# Patient Record
Sex: Male | Born: 1950 | Race: White | Hispanic: No | Marital: Married | Smoking: Current every day smoker
Health system: Southern US, Community
[De-identification: ages and names within clinical notes are randomized; demographics above are authoritative.]

## PROBLEM LIST (undated history)

## (undated) DIAGNOSIS — Z974 Presence of external hearing-aid: Secondary | ICD-10-CM

## (undated) DIAGNOSIS — C801 Malignant (primary) neoplasm, unspecified: Secondary | ICD-10-CM

## (undated) DIAGNOSIS — R569 Unspecified convulsions: Secondary | ICD-10-CM

## (undated) DIAGNOSIS — E7800 Pure hypercholesterolemia, unspecified: Secondary | ICD-10-CM

## (undated) DIAGNOSIS — M1990 Unspecified osteoarthritis, unspecified site: Secondary | ICD-10-CM

## (undated) DIAGNOSIS — K219 Gastro-esophageal reflux disease without esophagitis: Secondary | ICD-10-CM

## (undated) DIAGNOSIS — Z8679 Personal history of other diseases of the circulatory system: Secondary | ICD-10-CM

## (undated) DIAGNOSIS — I639 Cerebral infarction, unspecified: Secondary | ICD-10-CM

## (undated) HISTORY — PX: HERNIA REPAIR: SHX51

## (undated) HISTORY — PX: KNEE ARTHROSCOPY: SHX127

## (undated) HISTORY — PX: MOUTH SURGERY: SHX715

---

## 2004-08-08 ENCOUNTER — Ambulatory Visit: Payer: Self-pay | Admitting: Unknown Physician Specialty

## 2004-10-04 ENCOUNTER — Ambulatory Visit: Payer: Self-pay | Admitting: Unknown Physician Specialty

## 2007-03-03 ENCOUNTER — Ambulatory Visit: Payer: Self-pay | Admitting: Internal Medicine

## 2007-03-04 ENCOUNTER — Ambulatory Visit: Payer: Self-pay | Admitting: Internal Medicine

## 2007-07-10 ENCOUNTER — Ambulatory Visit: Payer: Self-pay | Admitting: Emergency Medicine

## 2007-09-01 ENCOUNTER — Ambulatory Visit: Payer: Self-pay | Admitting: Emergency Medicine

## 2007-11-25 ENCOUNTER — Ambulatory Visit: Payer: Self-pay | Admitting: Internal Medicine

## 2008-05-24 ENCOUNTER — Other Ambulatory Visit: Payer: Self-pay

## 2008-05-24 ENCOUNTER — Emergency Department: Payer: Self-pay | Admitting: Emergency Medicine

## 2009-11-29 ENCOUNTER — Ambulatory Visit: Payer: Self-pay | Admitting: Cardiovascular Disease

## 2009-11-30 ENCOUNTER — Ambulatory Visit: Payer: Self-pay | Admitting: Cardiovascular Disease

## 2010-04-12 ENCOUNTER — Ambulatory Visit: Payer: Self-pay | Admitting: Internal Medicine

## 2010-07-17 ENCOUNTER — Ambulatory Visit: Payer: Self-pay | Admitting: Internal Medicine

## 2011-02-21 ENCOUNTER — Ambulatory Visit: Payer: Self-pay | Admitting: Family Medicine

## 2011-07-23 ENCOUNTER — Ambulatory Visit: Payer: Self-pay

## 2011-08-29 DIAGNOSIS — C069 Malignant neoplasm of mouth, unspecified: Secondary | ICD-10-CM | POA: Insufficient documentation

## 2011-09-27 ENCOUNTER — Ambulatory Visit: Payer: Self-pay | Admitting: Emergency Medicine

## 2011-09-27 DIAGNOSIS — I10 Essential (primary) hypertension: Secondary | ICD-10-CM

## 2011-10-02 ENCOUNTER — Ambulatory Visit: Payer: Self-pay | Admitting: Emergency Medicine

## 2013-04-15 ENCOUNTER — Ambulatory Visit: Payer: Self-pay | Admitting: Physician Assistant

## 2013-04-29 ENCOUNTER — Ambulatory Visit: Payer: Self-pay | Admitting: Physician Assistant

## 2013-06-08 ENCOUNTER — Ambulatory Visit: Payer: Self-pay | Admitting: Unknown Physician Specialty

## 2013-06-09 LAB — PATHOLOGY REPORT

## 2013-10-29 ENCOUNTER — Ambulatory Visit: Payer: Self-pay | Admitting: Physician Assistant

## 2013-10-29 LAB — RAPID INFLUENZA A&B ANTIGENS

## 2013-10-29 LAB — RAPID STREP-A WITH REFLX: Micro Text Report: NEGATIVE

## 2013-11-01 LAB — BETA STREP CULTURE(ARMC)

## 2014-09-02 ENCOUNTER — Ambulatory Visit: Payer: Self-pay | Admitting: Internal Medicine

## 2014-10-13 ENCOUNTER — Ambulatory Visit: Payer: Self-pay | Admitting: Emergency Medicine

## 2014-10-13 LAB — RAPID STREP-A WITH REFLX: Micro Text Report: NEGATIVE

## 2014-10-16 LAB — BETA STREP CULTURE(ARMC)

## 2014-11-25 ENCOUNTER — Ambulatory Visit: Payer: Self-pay | Admitting: Physician Assistant

## 2015-01-16 NOTE — Op Note (Signed)
PATIENT NAME:  Erik Thompson, Erik Thompson MR#:  [id number] DATE OF BIRTH:  [birth year]  DATE OF PROCEDURE:  10/02/2011  PREOPERATIVE DIAGNOSIS: Left inguinal hernia.   POSTOPERATIVE DIAGNOSIS: Left inguinal hernia.  OPERATION: Repair of left inguinal hernia with mesh.   SURGEON: Vella Kohler, MD    OPERATIVE FINDING:  1. A direct hernia.  2. No evidence of any indirect hernia seen.   DESCRIPTION OF PROCEDURE: This patient was having pain in the left groin and feels a lump in the left groin. Here surgery was performed on the right side when he was young. The patient was then brought to surgery under general anesthesia. The left groin was then prepped and draped. A small incision was made. After cutting skin and subcutaneous tissue, the fascia was cut. The external oblique was quite effaced with the cord. The cord structures were very thin. The external oblique was opened. The ilioinguinal nerve was saved, and the cord was then lifted up.  I checked thoroughly the cord and did not see any indirect hernia. The patient does have a direct hernia. After that, a piece of mesh was then put in the floor of the inguinal canal and then brought around the cord and sutured with 0 Surgilon sutures to the shelving edge of inguinal ligament very clearly as to the conjoined tendon area and around the cord. After the external oblique was closed partially to form the new ring, the external oblique was then closed with 0 Vicryl sutures. Skin was closed with the staples. Marcaine was injected. The patient tolerated the procedure well and was sent to the recovery room in satisfactory condition.  ____________________________ Welford Roche Phylis Bougie, MD [id number] D: 10/02/2011 13:44:36 ET T: 10/02/2011 15:29:47 ET JOB#: [id number]  cc: Urban Naval S. Phylis Bougie, MD, <Dictator> Venetia Maxon. Elijio Miles, MD Sharene Butters MD ELECTRONICALLY SIGNED 10/04/2011 10:18

## 2015-06-11 ENCOUNTER — Encounter: Payer: Self-pay | Admitting: *Deleted

## 2015-06-11 ENCOUNTER — Ambulatory Visit
Admission: EM | Admit: 2015-06-11 | Discharge: 2015-06-11 | Disposition: A | Discharge status: Home / Self Care | Payer: BLUE CROSS/BLUE SHIELD | Attending: Family Medicine | Admitting: Family Medicine

## 2015-06-11 DIAGNOSIS — J0100 Acute maxillary sinusitis, unspecified: Secondary | ICD-10-CM | POA: Diagnosis not present

## 2015-06-11 LAB — RAPID STREP SCREEN (MED CTR MEBANE ONLY): Streptococcus, Group A Screen (Direct): NEGATIVE

## 2015-06-11 MED ORDER — AMOXICILLIN 875 MG PO TABS: 875.0000 mg | ORAL_TABLET | Freq: Two times a day (BID) | ORAL | Status: DC

## 2015-06-11 NOTE — ED Notes (Signed)
Pt states that he started having sore throat, body aches, fever, congestion about 3 days ago.

## 2015-06-11 NOTE — ED Provider Notes (Signed)
CSN: [id number]     Arrival date & time 06/11/15  1000 History   First MD Initiated Contact with Patient 06/11/15 1123     Chief Complaint  Patient presents with  . Sore Throat  . Cough  . Generalized Body Aches   (Consider location/radiation/quality/duration/timing/severity/associated sxs/prior Treatment) Patient is a 64 y.o. male presenting with pharyngitis and cough. The history is provided by the patient.  Sore Throat This is a new problem. The current episode started more than 2 days ago. The problem occurs constantly. Associated symptoms include headaches. Pertinent negatives include no chest pain, no abdominal pain and no shortness of breath. Associated symptoms comments: Sinus pressure, sinus headache, nasal congestion. Nothing relieves the symptoms.  Cough Associated symptoms: headaches   Associated symptoms: no chest pain and no shortness of breath     History reviewed. No pertinent past medical history. History reviewed. No pertinent past surgical history. No family history on file. Social History  Substance Use Topics  . Smoking status: Current Every Day Smoker  . Smokeless tobacco: None  . Alcohol Use: No    Review of Systems  Respiratory: Positive for cough. Negative for shortness of breath.   Cardiovascular: Negative for chest pain.  Gastrointestinal: Negative for abdominal pain.  Neurological: Positive for headaches.    Allergies  Sulfa antibiotics  Home Medications   Prior to Admission medications   Medication Sig Start Date End Date Taking? Authorizing Provider  cetirizine (ZYRTEC) 10 MG tablet Take 10 mg by mouth daily.   Yes Historical Provider, MD  Fish Oil-Cholecalciferol (FISH OIL + D3 PO) Take by mouth.   Yes Historical Provider, MD  omeprazole (PRILOSEC OTC) 20 MG tablet Take 20 mg by mouth daily.   Yes Historical Provider, MD  rosuvastatin (CRESTOR) 5 MG tablet Take 5 mg by mouth daily.   Yes Historical Provider, MD  amoxicillin (AMOXIL) 875 MG  tablet Take 1 tablet (875 mg total) by mouth 2 (two) times daily. 06/11/15   Norval Gable, MD   Meds Ordered and Administered this Visit  Medications - No data to display  BP 136/70 mmHg  Pulse 71  Temp(Src) 98.5 F (36.9 C) (Oral)  Ht 5' 10.5" (1.791 m)  Wt 154 lb (69.854 kg)  BMI 21.78 kg/m2  SpO2 100% No data found.   Physical Exam  Constitutional: He appears well-developed and well-nourished. No distress.  HENT:  Head: Normocephalic and atraumatic.  Right Ear: Tympanic membrane, external ear and ear canal normal.  Left Ear: Tympanic membrane, external ear and ear canal normal.  Nose: Mucosal edema and rhinorrhea present. Right sinus exhibits maxillary sinus tenderness and frontal sinus tenderness. Left sinus exhibits maxillary sinus tenderness and frontal sinus tenderness.  Mouth/Throat: Uvula is midline and mucous membranes are normal. Posterior oropharyngeal erythema present. No oropharyngeal exudate, posterior oropharyngeal edema or tonsillar abscesses.  Eyes: Conjunctivae and EOM are normal. Pupils are equal, round, and reactive to light. Right eye exhibits no discharge. Left eye exhibits no discharge. No scleral icterus.  Neck: Normal range of motion. Neck supple. No tracheal deviation present. No thyromegaly present.  Cardiovascular: Normal rate, regular rhythm and normal heart sounds.   Pulmonary/Chest: Effort normal and breath sounds normal. No stridor. No respiratory distress. He has no wheezes. He has no rales. He exhibits no tenderness.  Lymphadenopathy:    He has no cervical adenopathy.  Neurological: He is alert.  Skin: Skin is warm and dry. No rash noted. He is not diaphoretic.  Nursing note and vitals  reviewed.   ED Course  Procedures (including critical care time)  Labs Review Labs Reviewed  RAPID STREP SCREEN (NOT AT [facility])  CULTURE, GROUP A STREP ([facility] ONLY)    Imaging Review No results found.   Visual Acuity Review  Right Eye Distance:   Left  Eye Distance:   Bilateral Distance:    Right Eye Near:   Left Eye Near:    Bilateral Near:         MDM   1. Acute maxillary sinusitis, recurrence not specified    Discharge Medication List as of 06/11/2015 11:40 AM    START taking these medications   Details  amoxicillin (AMOXIL) 875 MG tablet Take 1 tablet (875 mg total) by mouth 2 (two) times daily., Starting 06/11/2015, Until Discontinued, Normal       Plan: 1.  diagnosis reviewed with patient 2. rx as per orders; risks, benefits, potential side effects reviewed with patient 3. Recommend supportive treatment with otc analgesics and flonase nasal spray 4. Recommend smoking cessation 5. F/u prn if symptoms worsen or don't improve   Norval Gable, MD 06/11/15 1141

## 2015-06-14 LAB — CULTURE, GROUP A STREP (THRC)

## 2015-09-08 ENCOUNTER — Ambulatory Visit
Admission: EM | Admit: 2015-09-08 | Discharge: 2015-09-08 | Disposition: A | Discharge status: Home / Self Care | Payer: BLUE CROSS/BLUE SHIELD | Attending: Family Medicine | Admitting: Family Medicine

## 2015-09-08 DIAGNOSIS — J069 Acute upper respiratory infection, unspecified: Secondary | ICD-10-CM | POA: Diagnosis not present

## 2015-09-08 HISTORY — DX: Malignant (primary) neoplasm, unspecified: C80.1

## 2015-09-08 HISTORY — DX: Gastro-esophageal reflux disease without esophagitis: K21.9

## 2015-09-08 HISTORY — DX: Pure hypercholesterolemia, unspecified: E78.00

## 2015-09-08 MED ORDER — HYDROCOD POLST-CPM POLST ER 10-8 MG/5ML PO SUER: 5.0000 mL | Freq: Two times a day (BID) | ORAL | Status: DC

## 2015-09-08 MED ORDER — CETIRIZINE-PSEUDOEPHEDRINE ER 5-120 MG PO TB12: 1.0000 | ORAL_TABLET | Freq: Two times a day (BID) | ORAL | Status: DC

## 2015-09-08 NOTE — ED Notes (Signed)
C/o runny nose and cough x 2-3 days. + nasal congestion

## 2015-09-08 NOTE — Discharge Instructions (Signed)
Cool Mist Vaporizers  Vaporizers may help relieve the symptoms of a cough and cold. They add moisture to the air, which helps mucus to become thinner and less sticky. This makes it easier to breathe and cough up secretions. Cool mist vaporizers do not cause serious burns like hot mist vaporizers, which may also be called steamers or humidifiers. Vaporizers have not been proven to help with colds. You should not use a vaporizer if you are allergic to mold.  HOME CARE INSTRUCTIONS  · Follow the package instructions for the vaporizer.  · Do not use anything other than distilled water in the vaporizer.  · Do not run the vaporizer all of the time. This can cause mold or bacteria to grow in the vaporizer.  · Clean the vaporizer after each time it is used.  · Clean and dry the vaporizer well before storing it.  · Stop using the vaporizer if worsening respiratory symptoms develop.     This information is not intended to replace advice given to you by your health care provider. Make sure you discuss any questions you have with your health care provider.     Document Released: 06/07/2004 Document Revised: 09/15/2013 Document Reviewed: 01/28/2013  Elsevier Interactive Patient Education ©2016 Elsevier Inc.

## 2015-09-08 NOTE — ED Provider Notes (Signed)
CSN: [id number]     Arrival date & time 09/08/15  [id number] History   First MD Initiated Contact with Patient 09/08/15 1130     Chief Complaint  Patient presents with  . URI   (Consider location/radiation/quality/duration/timing/severity/associated sxs/prior Treatment) HPI   This a 64 year old woman whose presents with head and chest congestion that he may have contracted from his granddaughter visited last week and was diagnosed with pneumonia. This happened 3 days in duration with a nonproductive cough a very congested head nausea without vomiting and some soft stool. Chills but no fever that he has measured and he was afebrile today in the office. His O2 sat is 100% on room air. He states that his nose runs copiously at times. He does not complain of a sore throat.   Past Medical History  Diagnosis Date  . GERD (gastroesophageal reflux disease)   . Cancer ([place])   . Hypercholesteremia    Past Surgical History  Procedure Laterality Date  . Mouth surgery     Family History  Problem Relation Age of Onset  . Cancer Mother    Social History  Substance Use Topics  . Smoking status: Current Every Day Smoker -- 0.50 packs/day    Types: Cigarettes  . Smokeless tobacco: None  . Alcohol Use: No    Review of Systems  Constitutional: Positive for chills, activity change and appetite change. Negative for fever, diaphoresis and fatigue.  HENT: Positive for congestion, postnasal drip, rhinorrhea, sinus pressure, sneezing and sore throat.   Respiratory: Positive for cough. Negative for wheezing and stridor.   Gastrointestinal: Positive for nausea. Negative for vomiting and diarrhea.    Allergies  Sulfa antibiotics  Home Medications   Prior to Admission medications   Medication Sig Start Date End Date Taking? Authorizing Provider  Fish Oil-Cholecalciferol (FISH OIL + D3 PO) Take by mouth.   Yes Historical Provider, MD  omeprazole (PRILOSEC OTC) 20 MG tablet Take 20 mg by mouth daily.    Yes Historical Provider, MD  rosuvastatin (CRESTOR) 5 MG tablet Take 5 mg by mouth daily.   Yes Historical Provider, MD  amoxicillin (AMOXIL) 875 MG tablet Take 1 tablet (875 mg total) by mouth 2 (two) times daily. 06/11/15   Norval Gable, MD  cetirizine (ZYRTEC) 10 MG tablet Take 10 mg by mouth daily.    Historical Provider, MD  cetirizine-pseudoephedrine (ZYRTEC-D) 5-120 MG tablet Take 1 tablet by mouth 2 (two) times daily. 09/08/15   Lorin Picket, PA-C  chlorpheniramine-HYDROcodone (TUSSIONEX PENNKINETIC ER) 10-8 MG/5ML SUER Take 5 mLs by mouth 2 (two) times daily. 09/08/15   Lorin Picket, PA-C   Meds Ordered and Administered this Visit  Medications - No data to display  BP 158/84 mmHg  Pulse 74  Temp(Src) 97.9 F (36.6 C) (Tympanic)  Resp 18  Ht 5' 10.5" (1.791 m)  Wt 158 lb (71.668 kg)  BMI 22.34 kg/m2  SpO2 98% No data found.   Physical Exam  Constitutional: He is oriented to person, place, and time. He appears well-developed and well-nourished. No distress.  HENT:  Head: Normocephalic and atraumatic.  Right Ear: External ear normal.  Left Ear: External ear normal.  Nose: Nose normal.  Mouth/Throat: Oropharynx is clear and moist. No oropharyngeal exudate.  Eyes: Conjunctivae are normal. Pupils are equal, round, and reactive to light.  Neck: Normal range of motion. Neck supple.  Pulmonary/Chest: Effort normal and breath sounds normal. No stridor. No respiratory distress. He has no wheezes. He has no  rales.  Musculoskeletal: Normal range of motion. He exhibits no edema or tenderness.  Lymphadenopathy:    He has no cervical adenopathy.  Neurological: He is alert and oriented to person, place, and time.  Skin: Skin is warm and dry. He is not diaphoretic.  Psychiatric: He has a normal mood and affect. His behavior is normal. Judgment and thought content normal.  Nursing note and vitals reviewed.   ED Course  Procedures (including critical care time)  Labs  Review Labs Reviewed - No data to display  Imaging Review No results found.   Visual Acuity Review  Right Eye Distance:   Left Eye Distance:   Bilateral Distance:    Right Eye Near:   Left Eye Near:    Bilateral Near:         MDM   1. URI, acute    Discharge Medication List as of 09/08/2015 11:47 AM    START taking these medications   Details  cetirizine-pseudoephedrine (ZYRTEC-D) 5-120 MG tablet Take 1 tablet by mouth 2 (two) times daily., Starting 09/08/2015, Until Discontinued, Print    chlorpheniramine-HYDROcodone (TUSSIONEX PENNKINETIC ER) 10-8 MG/5ML SUER Take 5 mLs by mouth 2 (two) times daily., Starting 09/08/2015, Until Discontinued, Print      Plan: 1. Diagnosis reviewed with patient 2. rx as per orders; risks, benefits, potential side effects reviewed with patient 3. Recommend supportive treatment with increase fluids and rest. Advised no antibiotics are indicated this time with such a short course and with negative findings today. I recommended that he use Flonase but he states that he is unable to tolerate anything sprayed into his nose therefore I will start him on some Zyrtec D for the decongestant and have given him a prescription for tussionex to take at nighttime to enable him to rest comfortably and not cough. if he continues to have problems I have recommended that he contact his primary care for further evaluation . 4. F/u prn if symptoms worsen or don't improve      Lorin Picket, PA-C 09/08/15 1203

## 2015-11-23 DIAGNOSIS — I639 Cerebral infarction, unspecified: Secondary | ICD-10-CM

## 2015-11-23 HISTORY — DX: Cerebral infarction, unspecified: I63.9

## 2015-12-01 ENCOUNTER — Encounter: Payer: Self-pay | Admitting: *Deleted

## 2015-12-01 ENCOUNTER — Ambulatory Visit
Admission: EM | Admit: 2015-12-01 | Discharge: 2015-12-01 | Disposition: A | Discharge status: Home / Self Care | Payer: BLUE CROSS/BLUE SHIELD | Attending: Family Medicine | Admitting: Family Medicine

## 2015-12-01 DIAGNOSIS — R531 Weakness: Secondary | ICD-10-CM | POA: Diagnosis not present

## 2015-12-01 DIAGNOSIS — R030 Elevated blood-pressure reading, without diagnosis of hypertension: Secondary | ICD-10-CM | POA: Diagnosis not present

## 2015-12-01 DIAGNOSIS — K219 Gastro-esophageal reflux disease without esophagitis: Secondary | ICD-10-CM | POA: Insufficient documentation

## 2015-12-01 DIAGNOSIS — E7800 Pure hypercholesterolemia, unspecified: Secondary | ICD-10-CM | POA: Diagnosis not present

## 2015-12-01 DIAGNOSIS — I639 Cerebral infarction, unspecified: Secondary | ICD-10-CM | POA: Insufficient documentation

## 2015-12-01 DIAGNOSIS — G8192 Hemiplegia, unspecified affecting left dominant side: Secondary | ICD-10-CM | POA: Insufficient documentation

## 2015-12-01 DIAGNOSIS — F17210 Nicotine dependence, cigarettes, uncomplicated: Secondary | ICD-10-CM | POA: Insufficient documentation

## 2015-12-01 DIAGNOSIS — R200 Anesthesia of skin: Secondary | ICD-10-CM | POA: Diagnosis not present

## 2015-12-01 DIAGNOSIS — IMO0001 Reserved for inherently not codable concepts without codable children: Secondary | ICD-10-CM

## 2015-12-01 NOTE — ED Notes (Signed)
IV lock attempted without success. Speech thick but not slurred. EMS arrived-informed patient "he looks good, symptoms subsiding". Left sided weakness, very slight left facial droop. Requesting to go to [facility]

## 2015-12-01 NOTE — ED Provider Notes (Signed)
CSN: [id number]     Arrival date & time 12/01/15  1155 History   None   Nurses notes were reviewed. Chief Complaint  Patient presents with  . Code Stroke  Patient is here because of possible stroke. He states he was doing his usual activity went to use go to the bathroom without work and had difficulty and bleeding. Reports numbness in the left leg left arm and weakness in the left arm and leg as well. He was unable to ambulate and they brought him here to be evaluated. No history of strokes before in the past no history of heart disease. 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 Unfortunately still smokes. He takes cholesterol medicine and has history of GERD. Mother had cancer. He is allergic to sulfa drugs.   He reports actually some improvement of the weakness of the left arm and left leg since he's been here. (Consider location/radiation/quality/duration/timing/severity/associated sxs/prior Treatment) Patient is a 65 y.o. male presenting with weakness. The history is provided by the patient (He two son in [place] that work with him came with him). No language interpreter was used.  Weakness This is a new problem. The current episode started less than 1 hour ago. The problem has been gradually improving. Pertinent negatives include no chest pain, no abdominal pain and no headaches. Nothing aggravates the symptoms. He has tried nothing for the symptoms. The treatment provided no relief.    Past Medical History  Diagnosis Date  . GERD (gastroesophageal reflux disease)   . Cancer ([place])   . Hypercholesteremia    Past Surgical History  Procedure Laterality Date  . Mouth surgery     Family History  Problem Relation Age of Onset  . Cancer Mother    Social History  Substance Use Topics  . Smoking status: Current Every Day Smoker -- 0.50 packs/day    Types: Cigarettes  . Smokeless tobacco: None  . Alcohol Use: No    Review of Systems  Cardiovascular: Negative for chest pain.   Gastrointestinal: Negative for abdominal pain.  Neurological: Positive for weakness. Negative for headaches.  All other systems reviewed and are negative.   Allergies  Sulfa antibiotics  Home Medications   Prior to Admission medications   Medication Sig Start Date End Date Taking? Authorizing Provider  amoxicillin (AMOXIL) 875 MG tablet Take 1 tablet (875 mg total) by mouth 2 (two) times daily. 06/11/15   Norval Gable, MD  cetirizine (ZYRTEC) 10 MG tablet Take 10 mg by mouth daily.    Historical Provider, MD  cetirizine-pseudoephedrine (ZYRTEC-D) 5-120 MG tablet Take 1 tablet by mouth 2 (two) times daily. 09/08/15   Lorin Picket, PA-C  chlorpheniramine-HYDROcodone (TUSSIONEX PENNKINETIC ER) 10-8 MG/5ML SUER Take 5 mLs by mouth 2 (two) times daily. 09/08/15   Lorin Picket, PA-C  Fish Oil-Cholecalciferol (FISH OIL + D3 PO) Take by mouth.    Historical Provider, MD  omeprazole (PRILOSEC OTC) 20 MG tablet Take 20 mg by mouth daily.    Historical Provider, MD  rosuvastatin (CRESTOR) 5 MG tablet Take 5 mg by mouth daily.    Historical Provider, MD   Meds Ordered and Administered this Visit  Medications - No data to display  BP 211/101 mmHg  Pulse 78  Resp 18  SpO2 100% No data found.   Physical Exam  Constitutional: He is oriented to person, place, and time. He appears well-developed and well-nourished.  HENT:  Head: Normocephalic and atraumatic.  Eyes: Pupils are equal,  round, and reactive to light.  Neck: Normal range of motion. Neck supple.  Cardiovascular: Normal rate, regular rhythm and normal heart sounds.   Pulmonary/Chest: Effort normal and breath sounds normal. No respiratory distress.  Musculoskeletal: Normal range of motion. He exhibits no edema.  Neurological: He is alert and oriented to person, place, and time. He displays normal reflexes. He exhibits abnormal muscle tone.  Patient has weakness in the left upper arm and lower leg.  Skin: Skin is warm.   Psychiatric: He has a normal mood and affect.  Vitals reviewed.  It appears the patient may have had some incontinence and his inability to go to the bathroom. Blood pressure also markedly elevated  ED Course  Procedures (including critical care time)  Labs Review Labs Reviewed - No data to display  Imaging Review No results found.   Visual Acuity Review  Right Eye Distance:   Left Eye Distance:   Bilateral Distance:    Right Eye Near:   Left Eye Near:    Bilateral Near:         MDM   1. Cerebral infarction due to unspecified mechanism   2. Hemiplegia affecting left dominant side (HCC)   3. Elevated blood pressure     Patient has weakness on the left upper lower extremity. Things improved since she's been here. EKG is normal. His factors hyperlipidemia and smoking will call a code stroke EMS has been notified to come. In his family wants to go to [facility] for recommend [facility] since the stroke center not [facility]. Fortunately his things do appear to be better and this may just be a TIA but obviously is knowing which make that determination at this time  ED ECG REPORT I, Tamecia Mcdougald H, the attending physician, personally viewed and interpreted this ECG.   Date: 12/01/2015  EKG Time:12:03:25  Rate: 72  Rhythm: there are no previous tracings available for comparison, normal sinus rhythm  Axis: 23  Intervals:none  ST&T Change: none  Normal sinus rhythm     Frederich Cha, MD 12/01/15 1812

## 2015-12-01 NOTE — Discharge Instructions (Signed)
Stroke Prevention Some medical conditions and behaviors are associated with an increased chance of having a stroke. You may prevent a stroke by making healthy choices and managing medical conditions. HOW CAN I REDUCE MY RISK OF HAVING A STROKE?   Stay physically active. Get at least 30 minutes of activity on most or all days.  Do not smoke. It may also be helpful to avoid exposure to secondhand smoke.  Limit alcohol use. Moderate alcohol use is considered to be:  No more than 2 drinks per day for men.  No more than 1 drink per day for nonpregnant women.  Eat healthy foods. This involves:  Eating 5 or more servings of fruits and vegetables a day.  Making dietary changes that address high blood pressure (hypertension), high cholesterol, diabetes, or obesity.  Manage your cholesterol levels.  Making food choices that are high in fiber and low in saturated fat, trans fat, and cholesterol may control cholesterol levels.  Take any prescribed medicines to control cholesterol as directed by your health care provider.  Manage your diabetes.  Controlling your carbohydrate and sugar intake is recommended to manage diabetes.  Take any prescribed medicines to control diabetes as directed by your health care provider.  Control your hypertension.  Making food choices that are low in salt (sodium), saturated fat, trans fat, and cholesterol is recommended to manage hypertension.  Ask your health care provider if you need treatment to lower your blood pressure. Take any prescribed medicines to control hypertension as directed by your health care provider.  If you are 18-39 years of age, have your blood pressure checked every 3-5 years. If you are 40 years of age or older, have your blood pressure checked every year.  Maintain a healthy weight.  Reducing calorie intake and making food choices that are low in sodium, saturated fat, trans fat, and cholesterol are recommended to manage  weight.  Stop drug abuse.  Avoid taking birth control pills.  Talk to your health care provider about the risks of taking birth control pills if you are over 35 years old, smoke, get migraines, or have ever had a blood clot.  Get evaluated for sleep disorders (sleep apnea).  Talk to your health care provider about getting a sleep evaluation if you snore a lot or have excessive sleepiness.  Take medicines only as directed by your health care provider.  For some people, aspirin or blood thinners (anticoagulants) are helpful in reducing the risk of forming abnormal blood clots that can lead to stroke. If you have the irregular heart rhythm of atrial fibrillation, you should be on a blood thinner unless there is a good reason you cannot take them.  Understand all your medicine instructions.  Make sure that other conditions (such as anemia or atherosclerosis) are addressed. SEEK IMMEDIATE MEDICAL CARE IF:   You have sudden weakness or numbness of the face, arm, or leg, especially on one side of the body.  Your face or eyelid droops to one side.  You have sudden confusion.  You have trouble speaking (aphasia) or understanding.  You have sudden trouble seeing in one or both eyes.  You have sudden trouble walking.  You have dizziness.  You have a loss of balance or coordination.  You have a sudden, severe headache with no known cause.  You have new chest pain or an irregular heartbeat. Any of these symptoms may represent a serious problem that is an emergency. Do not wait to see if the symptoms will   go away. Get medical help at once. Call your local emergency services (911 in [place] Do not drive yourself to the hospital.   This information is not intended to replace advice given to you by your health care provider. Make sure you discuss any questions you have with your health care provider.   Document Released: 10/18/2004 Document Revised: 10/01/2014 Document Reviewed:  03/13/2013 Elsevier Interactive Patient Education 2016 Elsevier Inc.  

## 2015-12-01 NOTE — ED Notes (Signed)
Family at bedside.Pt. Requests transport still to [facility]. EMS decided that patient okay to go to [facility] by EMS. Dr. Alveta Heimlich informed

## 2015-12-01 NOTE — ED Notes (Signed)
Report called to [facility] in [place] via EMS

## 2015-12-01 NOTE — ED Notes (Signed)
Patient started having left side weakness 10 minutes before arrival to [place].

## 2015-12-02 DIAGNOSIS — F17200 Nicotine dependence, unspecified, uncomplicated: Secondary | ICD-10-CM | POA: Insufficient documentation

## 2015-12-02 DIAGNOSIS — I639 Cerebral infarction, unspecified: Secondary | ICD-10-CM | POA: Insufficient documentation

## 2015-12-15 DIAGNOSIS — I10 Essential (primary) hypertension: Secondary | ICD-10-CM | POA: Insufficient documentation

## 2016-04-13 ENCOUNTER — Ambulatory Visit
Admission: EM | Admit: 2016-04-13 | Discharge: 2016-04-13 | Disposition: A | Discharge status: Home / Self Care | Payer: Medicare Other

## 2016-04-13 DIAGNOSIS — J0100 Acute maxillary sinusitis, unspecified: Secondary | ICD-10-CM

## 2016-04-13 DIAGNOSIS — H6091 Unspecified otitis externa, right ear: Secondary | ICD-10-CM | POA: Diagnosis not present

## 2016-04-13 MED ORDER — CIPROFLOXACIN-DEXAMETHASONE 0.3-0.1 % OT SUSP: 4.0000 [drp] | Freq: Two times a day (BID) | OTIC | Status: AC

## 2016-04-13 MED ORDER — AMOXICILLIN-POT CLAVULANATE 875-125 MG PO TABS: 1.0000 | ORAL_TABLET | Freq: Two times a day (BID) | ORAL | Status: DC

## 2016-04-13 NOTE — Discharge Instructions (Signed)
Take medication as prescribed. Rest. Drink plenty of fluids.   Follow up with your primary care physician this week as needed. Return to Urgent care for new or worsening concerns.    Otitis Externa Otitis externa is a bacterial or fungal infection of the outer ear canal. This is the area from the eardrum to the outside of the ear. Otitis externa is sometimes called "swimmer's ear." CAUSES  Possible causes of infection include:  Swimming in dirty water.  Moisture remaining in the ear after swimming or bathing.  Mild injury (trauma) to the ear.  Objects stuck in the ear (foreign body).  Cuts or scrapes (abrasions) on the outside of the ear. SIGNS AND SYMPTOMS  The first symptom of infection is often itching in the ear canal. Later signs and symptoms may include swelling and redness of the ear canal, ear pain, and yellowish-white fluid (pus) coming from the ear. The ear pain may be worse when pulling on the earlobe. DIAGNOSIS  Your health care provider will perform a physical exam. A sample of fluid may be taken from the ear and examined for bacteria or fungi. TREATMENT  Antibiotic ear drops are often given for 10 to 14 days. Treatment may also include pain medicine or corticosteroids to reduce itching and swelling. HOME CARE INSTRUCTIONS   Apply antibiotic ear drops to the ear canal as prescribed by your health care provider.  Take medicines only as directed by your health care provider.  If you have diabetes, follow any additional treatment instructions from your health care provider.  Keep all follow-up visits as directed by your health care provider. PREVENTION   Keep your ear dry. Use the corner of a towel to absorb water out of the ear canal after swimming or bathing.  Avoid scratching or putting objects inside your ear. This can damage the ear canal or remove the protective wax that lines the canal. This makes it easier for bacteria and fungi to grow.  Avoid swimming in  lakes, polluted water, or poorly chlorinated pools.  You may use ear drops made of rubbing alcohol and vinegar after swimming. Combine equal parts of white vinegar and alcohol in a bottle. Put 3 or 4 drops into each ear after swimming. SEEK MEDICAL CARE IF:   You have a fever.  Your ear is still red, swollen, painful, or draining pus after 3 days.  Your redness, swelling, or pain gets worse.  You have a severe headache.  You have redness, swelling, pain, or tenderness in the area behind your ear. MAKE SURE YOU:   Understand these instructions.  Will watch your condition.  Will get help right away if you are not doing well or get worse.   This information is not intended to replace advice given to you by your health care provider. Make sure you discuss any questions you have with your health care provider.   Document Released: 09/10/2005 Document Revised: 10/01/2014 Document Reviewed: 09/27/2011 Elsevier Interactive Patient Education 2016 Elsevier [employer]  Sinusitis, Adult Sinusitis is redness, soreness, and inflammation of the paranasal sinuses. Paranasal sinuses are air pockets within the bones of your face. They are located beneath your eyes, in the middle of your forehead, and above your eyes. In healthy paranasal sinuses, mucus is able to drain out, and air is able to circulate through them by way of your nose. However, when your paranasal sinuses are inflamed, mucus and air can become trapped. This can allow bacteria and other germs to grow and cause  infection. Sinusitis can develop quickly and last only a short time (acute) or continue over a long period (chronic). Sinusitis that lasts for more than 12 weeks is considered chronic. CAUSES Causes of sinusitis include:  Allergies.  Structural abnormalities, such as displacement of the cartilage that separates your nostrils (deviated septum), which can decrease the air flow through your nose and sinuses and affect sinus  drainage.  Functional abnormalities, such as when the small hairs (cilia) that line your sinuses and help remove mucus do not work properly or are not present. SIGNS AND SYMPTOMS Symptoms of acute and chronic sinusitis are the same. The primary symptoms are pain and pressure around the affected sinuses. Other symptoms include:  Upper toothache.  Earache.  Headache.  Bad breath.  Decreased sense of smell and taste.  A cough, which worsens when you are lying flat.  Fatigue.  Fever.  Thick drainage from your nose, which often is green and may contain pus (purulent).  Swelling and warmth over the affected sinuses. DIAGNOSIS Your health care provider will perform a physical exam. During your exam, your health care provider may perform any of the following to help determine if you have acute sinusitis or chronic sinusitis:  Look in your nose for signs of abnormal growths in your nostrils (nasal polyps).  Tap over the affected sinus to check for signs of infection.  View the inside of your sinuses using an imaging device that has a light attached (endoscope). If your health care provider suspects that you have chronic sinusitis, one or more of the following tests may be recommended:  Allergy tests.  Nasal culture. A sample of mucus is taken from your nose, sent to a lab, and screened for bacteria.  Nasal cytology. A sample of mucus is taken from your nose and examined by your health care provider to determine if your sinusitis is related to an allergy. TREATMENT Most cases of acute sinusitis are related to a viral infection and will resolve on their own within 10 days. Sometimes, medicines are prescribed to help relieve symptoms of both acute and chronic sinusitis. These may include pain medicines, decongestants, nasal steroid sprays, or saline sprays. However, for sinusitis related to a bacterial infection, your health care provider will prescribe antibiotic medicines. These are  medicines that will help kill the bacteria causing the infection. Rarely, sinusitis is caused by a fungal infection. In these cases, your health care provider will prescribe antifungal medicine. For some cases of chronic sinusitis, surgery is needed. Generally, these are cases in which sinusitis recurs more than 3 times per year, despite other treatments. HOME CARE INSTRUCTIONS  Drink plenty of water. Water helps thin the mucus so your sinuses can drain more easily.  Use a humidifier.  Inhale steam 3-4 times a day (for example, sit in the bathroom with the shower running).  Apply a warm, moist washcloth to your face 3-4 times a day, or as directed by your health care provider.  Use saline nasal sprays to help moisten and clean your sinuses.  Take medicines only as directed by your health care provider.  If you were prescribed either an antibiotic or antifungal medicine, finish it all even if you start to feel better. SEEK IMMEDIATE MEDICAL CARE IF:  You have increasing pain or severe headaches.  You have nausea, vomiting, or drowsiness.  You have swelling around your face.  You have vision problems.  You have a stiff neck.  You have difficulty breathing.   This information is  not intended to replace advice given to you by your health care provider. Make sure you discuss any questions you have with your health care provider.   Document Released: 09/10/2005 Document Revised: 10/01/2014 Document Reviewed: 09/25/2011 Elsevier Interactive Patient Education [employer].

## 2016-04-13 NOTE — ED Notes (Signed)
Patient complains of right ear pain, cough, drainage. Patient states that symptoms started a few days ago. Patient states that he wears hearing aids. Patient states that he also had a blood tinged mucus when he blew his nose.

## 2016-04-13 NOTE — ED Provider Notes (Signed)
[facility]  ____________________________________________  Time seen: Approximately 8:26 AM  I have reviewed the triage vital signs and the nursing notes.   HISTORY  Chief Complaint Otalgia   HPI Erik Thompson. is a 65 y.o. male presents for complaints of right ear pain. Reports 4-5 days of runny nose, nasal congestion, postnasal drainage. States cough x two days. States frequently blowing his nose and getting thick greenish mucous out, states yesterday blew his nose and had gotten a small amount of blood tinged mucous mixed in with greenish mucous once. Denies coughing up blood. Denies any other abnormal bleeding. Denies nosebleeds. States right ear feels moist and intermittently has had some drainage. States right ear pain is mild. Patient reports that he does wear bilateral hearing aids and sometimes moisture gets trapped in his ears.  Denies fevers. Reports continues to eat and drink well. Denies chest pain, shortness of breath, dizziness, weakness, neck or back pain, rash, weakness, blood in stool, vomiting, abdominal pain, rash or extremity swelling.   PCP: Te-jansie   Past Medical History  Diagnosis Date  . GERD (gastroesophageal reflux disease)   . Cancer ([place])   . Hypercholesteremia    CVA  There are no active problems to display for this patient.   Past Surgical History  Procedure Laterality Date  . Mouth surgery      Current Outpatient Rx  Name  Route  Sig  Dispense  Refill  . amLODipine (NORVASC) 2.5 MG tablet   Oral   Take 2.5 mg by mouth daily.         Marland Kitchen aspirin 81 MG tablet   Oral   Take 81 mg by mouth daily.         Marland Kitchen co-enzyme Q-10 50 MG capsule   Oral   Take 200 mg by mouth daily.         . rosuvastatin (CRESTOR) 5 MG tablet   Oral   Take 5 mg by mouth daily.         .           .           .           .           .           .           Teressa Senter Oil-Cholecalciferol (FISH OIL + D3 PO)   Oral   Take by mouth.          Marland Kitchen omeprazole (PRILOSEC OTC) 20 MG tablet   Oral   Take 20 mg by mouth daily.           Allergies Sulfa antibiotics  Family History  Problem Relation Age of Onset  . Cancer Mother     Social History Social History  Substance Use Topics  . Smoking status: Current Every Day Smoker -- 0.50 packs/day    Types: Cigarettes  . Smokeless tobacco: None  . Alcohol Use: No    Review of Systems Constitutional: No fever/chills Eyes: No visual changes. ENT: No sore throat. As above.  Cardiovascular: Denies chest pain. Respiratory: Denies shortness of breath. Gastrointestinal: No abdominal pain.  No nausea, no vomiting.  No diarrhea.  No constipation. Genitourinary: Negative for dysuria. Musculoskeletal: Negative for back pain. Skin: Negative for rash. Neurological: Negative for headaches, focal weakness or numbness.  10-point ROS otherwise negative.  ____________________________________________   PHYSICAL EXAM:  VITAL SIGNS:  ED Triage Vitals  Enc Vitals Group     BP 04/13/16 0811 127/71 mmHg     Pulse Rate 04/13/16 0811 69     Resp 04/13/16 0811 17     Temp 04/13/16 0811 97.5 F (36.4 C)     Temp Source 04/13/16 0811 Oral     SpO2 04/13/16 0811 99 %     Weight 04/13/16 0811 154 lb (69.854 kg)     Height 04/13/16 0811 5\' 10"  (1.778 m)     Head Cir --      Peak Flow --      Pain Score 04/13/16 0813 8     Pain Loc --      Pain Edu? --      Excl. in [place]? --     Constitutional: Alert and oriented. Well appearing and in no acute distress. Eyes: Conjunctivae are normal. PERRL. EOMI. Head: Atraumatic. Mild tenderness to palpation bilateral maxillary sinuses. No frontal sinus tenderness to palpation. No swelling. No erythema.   Ears: Left: Nontender, no exudate, normal TM. Right: mild tenderness, mild whitish exudate in external canal with mild swelling, mild erythema at TM, no bulging TM. TM appears intact. No swelling or erythema surrounding bilaterally.      Nose: nasal congestion with bilateral nasal turbinate erythema and edema. No epistaxis.   Mouth/Throat: Mucous membranes are moist.  Oropharynx non-erythematous.No tonsillar swelling or exudate.  Neck: No stridor.  No cervical spine tenderness to palpation. Hematological/Lymphatic/Immunilogical: No cervical lymphadenopathy. Cardiovascular: Normal rate, regular rhythm. Grossly normal heart sounds.  Good peripheral circulation. Respiratory: Normal respiratory effort.  No retractions. Lungs CTAB. No wheezes, rales or rhonchi. Good air movement.  Gastrointestinal: Soft and nontender. No distention.  Musculoskeletal: No lower or upper extremity tenderness nor edema. No cervical, thoracic or lumbar tenderness to palpation.  Neurologic:  Normal speech and language. No gross focal neurologic deficits are appreciated. No gait instability. Skin:  Skin is warm, dry and intact. No rash noted. Psychiatric: Mood and affect are normal. Speech and behavior are normal.  ____________________________________________   INITIAL IMPRESSION / ASSESSMENT AND PLAN / ED COURSE  Pertinent labs & imaging results that were available during my care of the patient were reviewed by me and considered in my medical decision making (see chart for details).  Well-appearing patient. No acute distress. Presents for the complaints of runny nose, nasal congestion, sinus pressure and right ear pain. Patient with a right external otitis and suspect sinusitis. Will treat with oral Augmentin and Ciprodex otic drops. Encourage rest, fluids and PCP follow-up.Discussed indication, risks and benefits of medications with patient.  Discussed follow up with Primary care physician this week. Discussed follow up and return parameters including no resolution or any worsening concerns. Patient verbalized understanding and agreed to plan.   ____________________________________________   FINAL CLINICAL IMPRESSION(S) / ED DIAGNOSES  Final  diagnoses:  External otitis, right  Acute maxillary sinusitis, recurrence not specified     New Prescriptions   AMOXICILLIN-CLAVULANATE (AUGMENTIN) 875-125 MG TABLET    Take 1 tablet by mouth every 12 (twelve) hours.   CIPROFLOXACIN-DEXAMETHASONE (CIPRODEX) OTIC SUSPENSION    Place 4 drops into the right ear 2 (two) times daily. For one week    Note: This dictation was prepared with Dragon dictation along with smaller phrase technology. Any transcriptional errors that result from this process are unintentional.      Marylene Land, NP 04/13/16 [phone]

## 2016-06-04 DIAGNOSIS — M79604 Pain in right leg: Secondary | ICD-10-CM | POA: Insufficient documentation

## 2016-06-05 IMAGING — CR DG CHEST 2V
1 series · 1 of 1 positions shown · non-contrast
Comparison: Images from a barium enema 04/15/2013.

CLINICAL DATA: 63-year-old male who fell 2 days ago with anterior
rib pain. Initial encounter.

EXAM:
CHEST  2 VIEW

[kdxr chest pa (or ap) and lat]
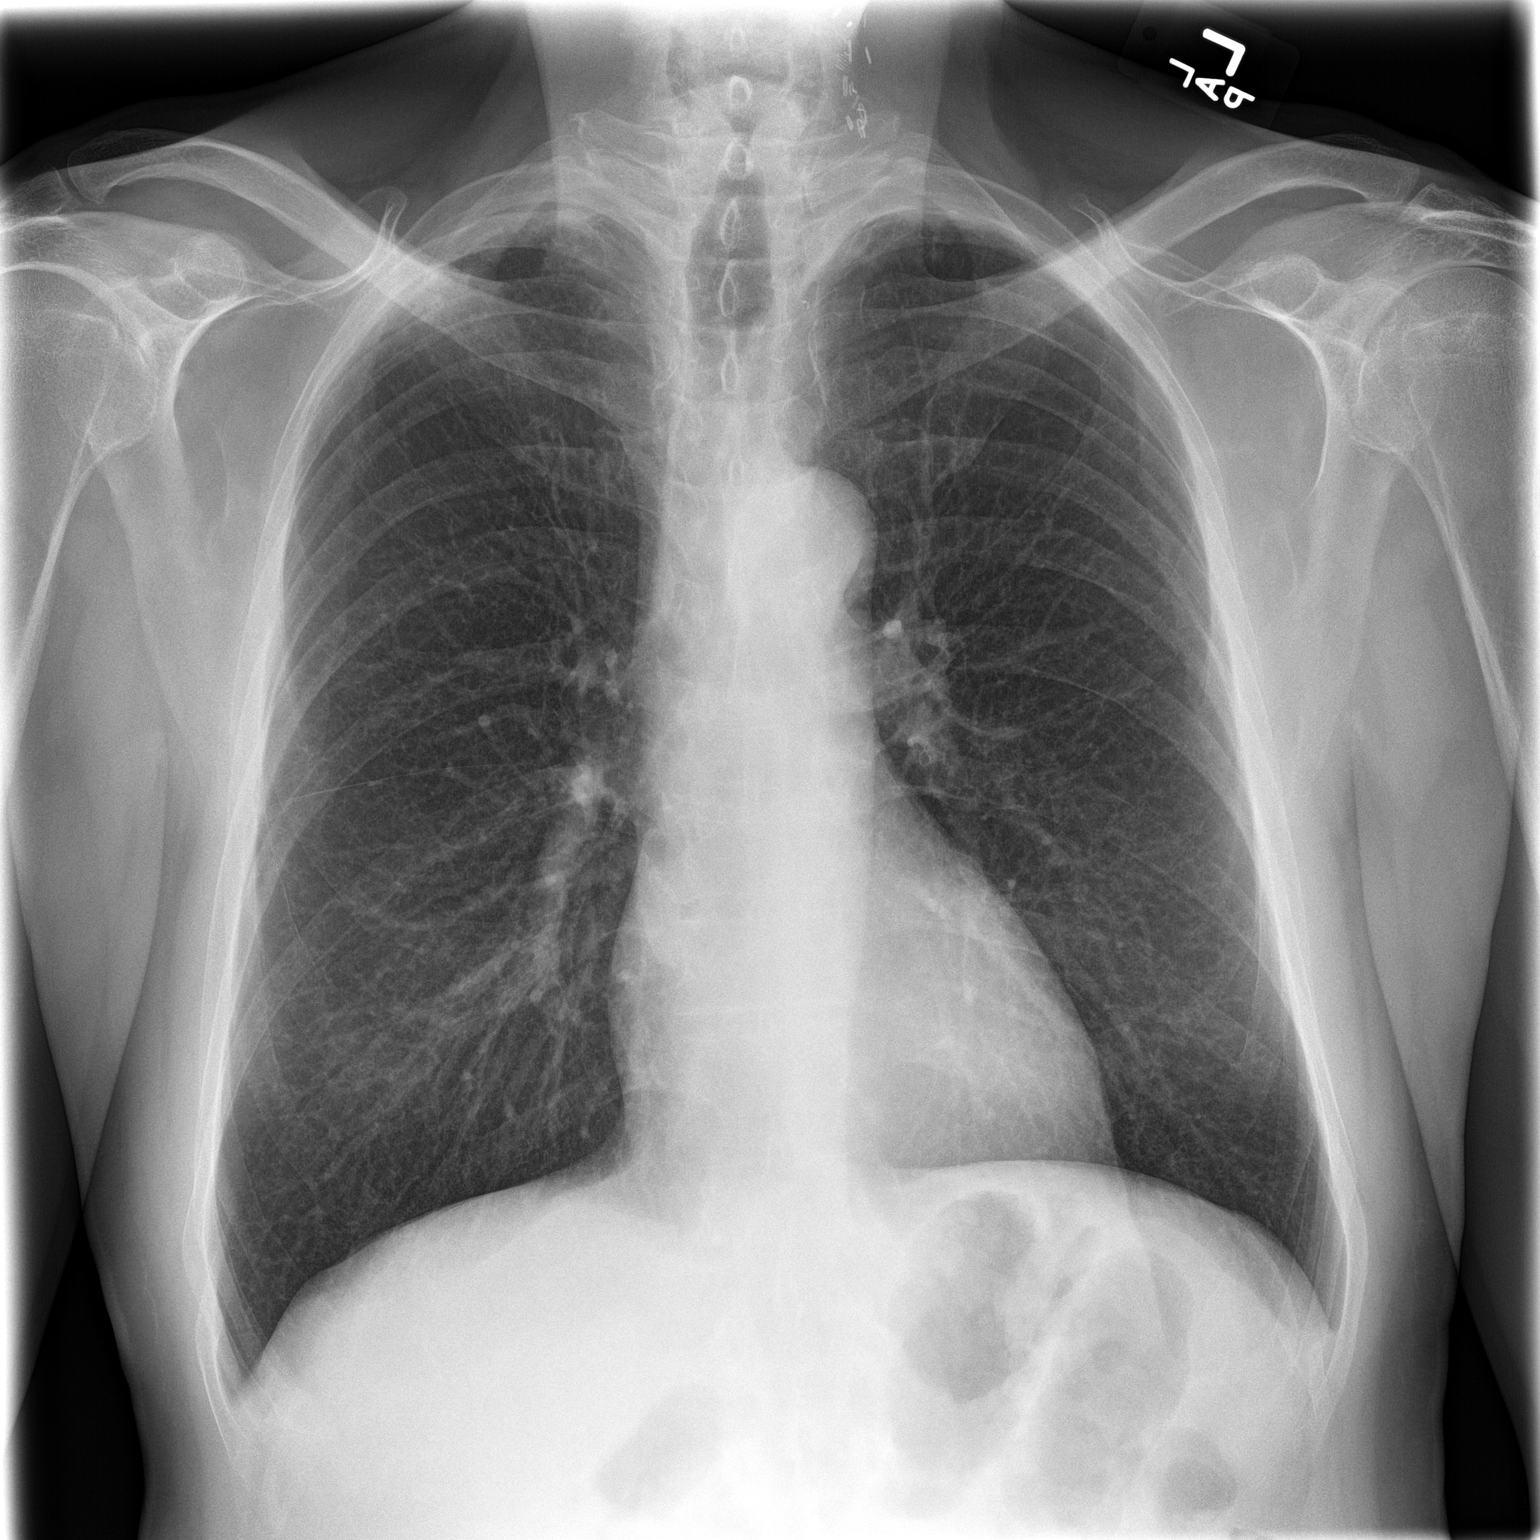

[1 of 1 positions shown; findings below may reference images not displayed]

FINDINGS: Surgical clips in the left neck may E relate 2 previous left carotid
surgery. Mildly increased lung volumes and increased AP dimension to
the chest. No pneumothorax, pulmonary edema, pleural effusion or
confluent pulmonary opacity. Mildly increased interstitial markings
diffusely. Normal cardiac size and mediastinal contours. Visualized
tracheal air column is within normal limits.

Osteopenia. No acute osseous abnormality identified. Anterior clear
space is normal.
IMPRESSION: 1. No acute cardiopulmonary abnormality or acute traumatic injury
identified.
2. A degree of hyperinflation and chronic pulmonary interstitial
changes are suspected.

## 2016-06-05 IMAGING — CR DG SHOULDER 3+V*L*
1 series · 3 of 3 positions shown · non-contrast
Comparison: None.

CLINICAL DATA: Left shoulder contusion after fall.

EXAM:
DG SHOULDER 3+VIEWS LEFT

[Series 1: kdxr shoulder left complete · 0.14mm/px · 3 of 3 slices shown]
[im 1/3]
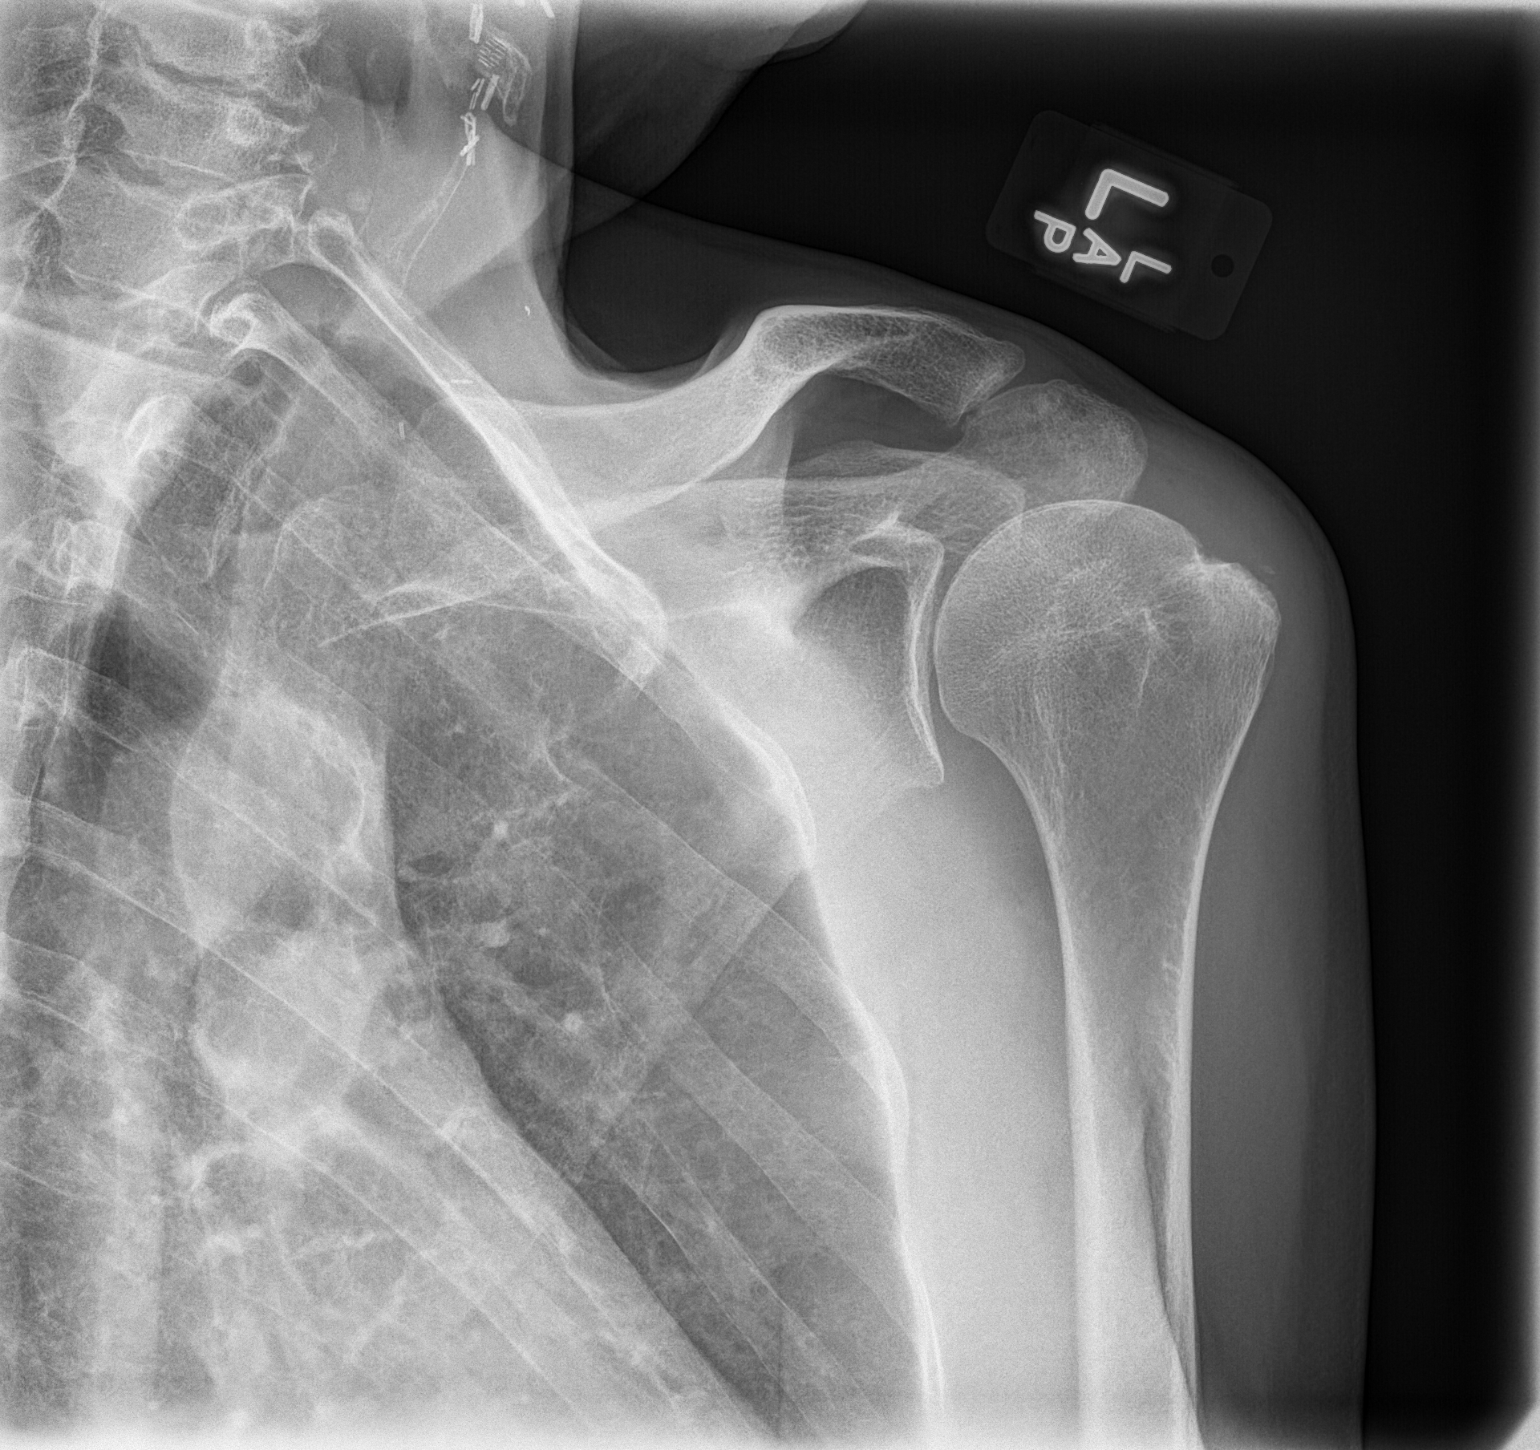
[im 2/3]
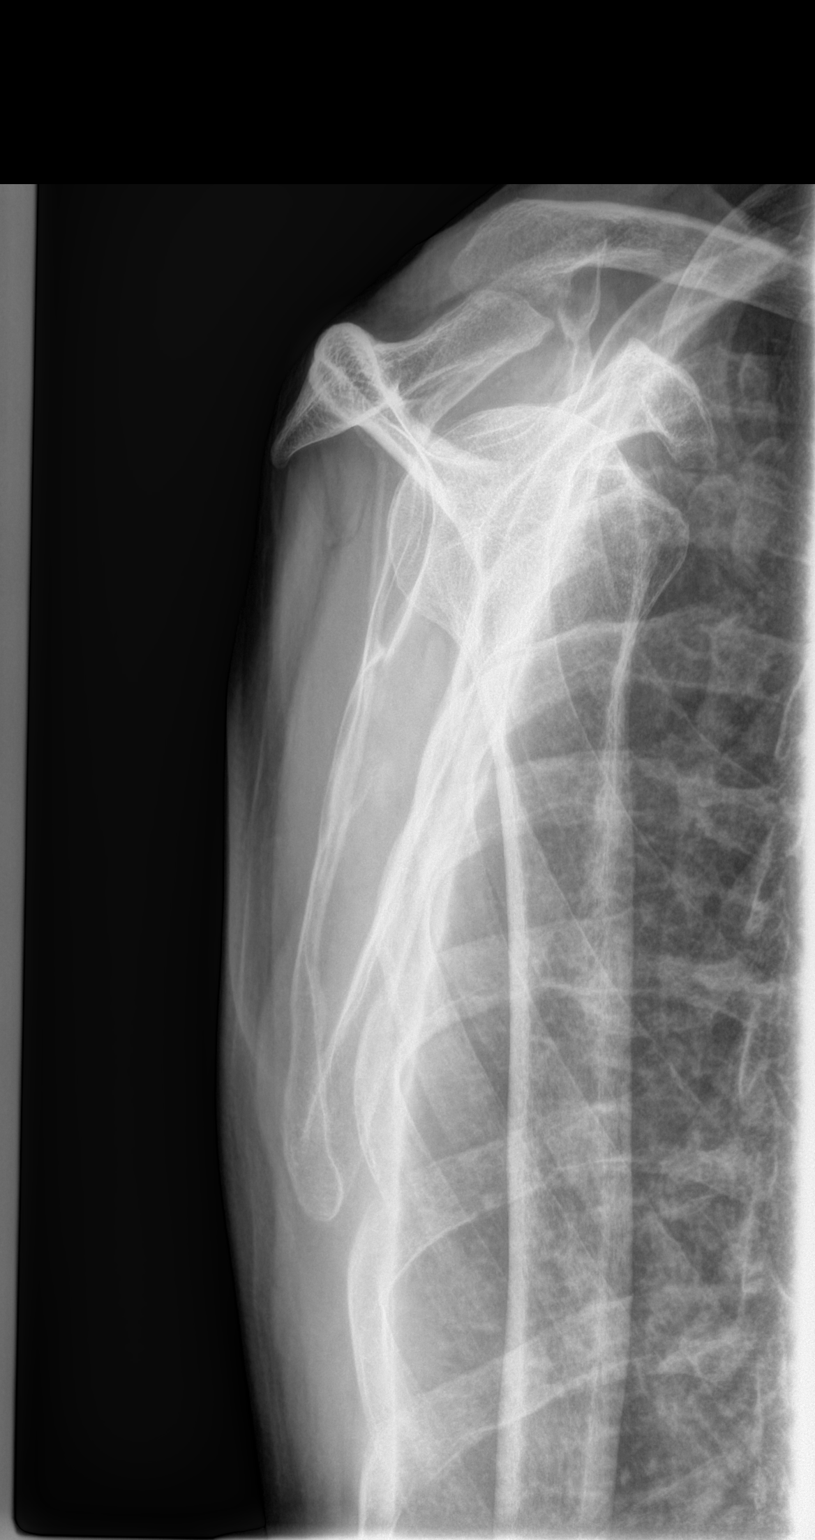
[im 3/3]
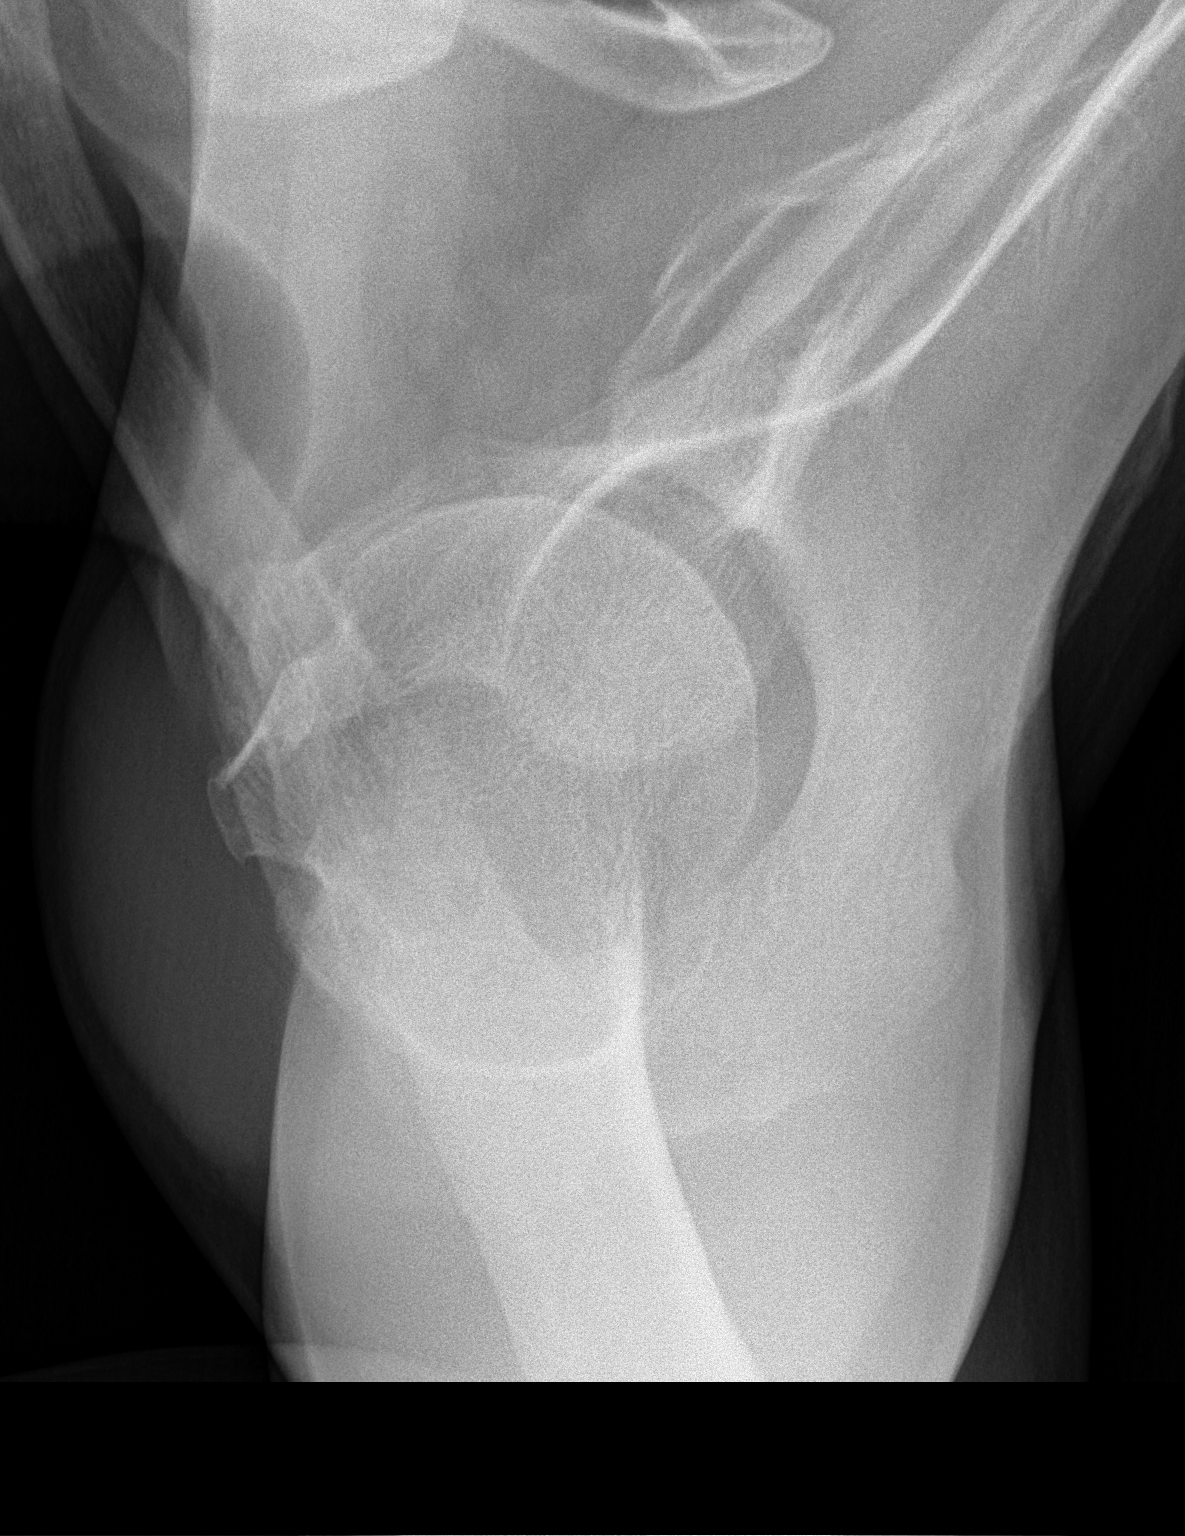

[3 of 3 positions shown; findings below may reference images not displayed]

FINDINGS: There is no evidence of fracture or dislocation. There is no
evidence of arthropathy or other focal bone abnormality. Soft
tissues are unremarkable.
IMPRESSION: Normal left shoulder.

## 2016-06-05 IMAGING — CR DG RIBS 2V*L*
1 series · 2 of 2 positions shown · non-contrast
Comparison: Chest radiographs from the same day reported
separately. Images from barium enema 04/15/2013.

CLINICAL DATA: 63-year-old male who fell 2 days ago with anterior
rib pain. Initial encounter.

EXAM:
LEFT RIBS - 2 VIEW

[Series 1: kdxr ribs left unilateral · 0.14mm/px · 2 of 2 slices shown]
[im 1/2]
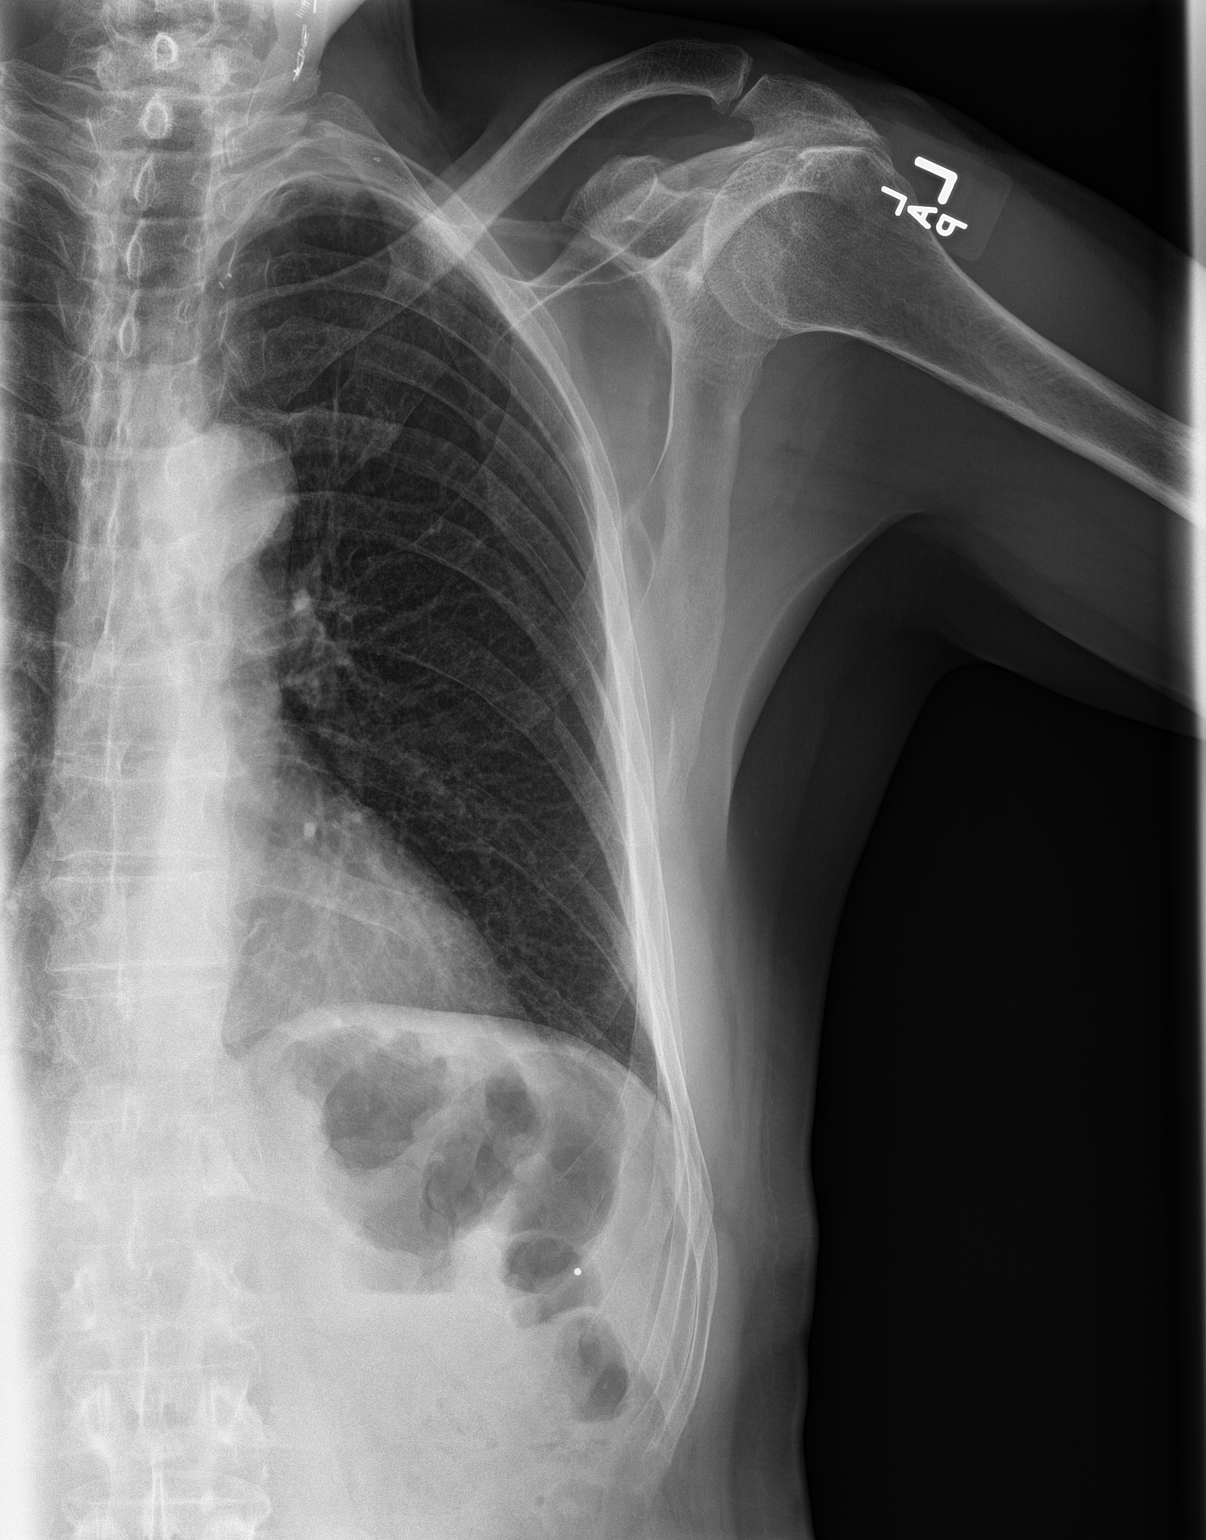
[im 2/2]
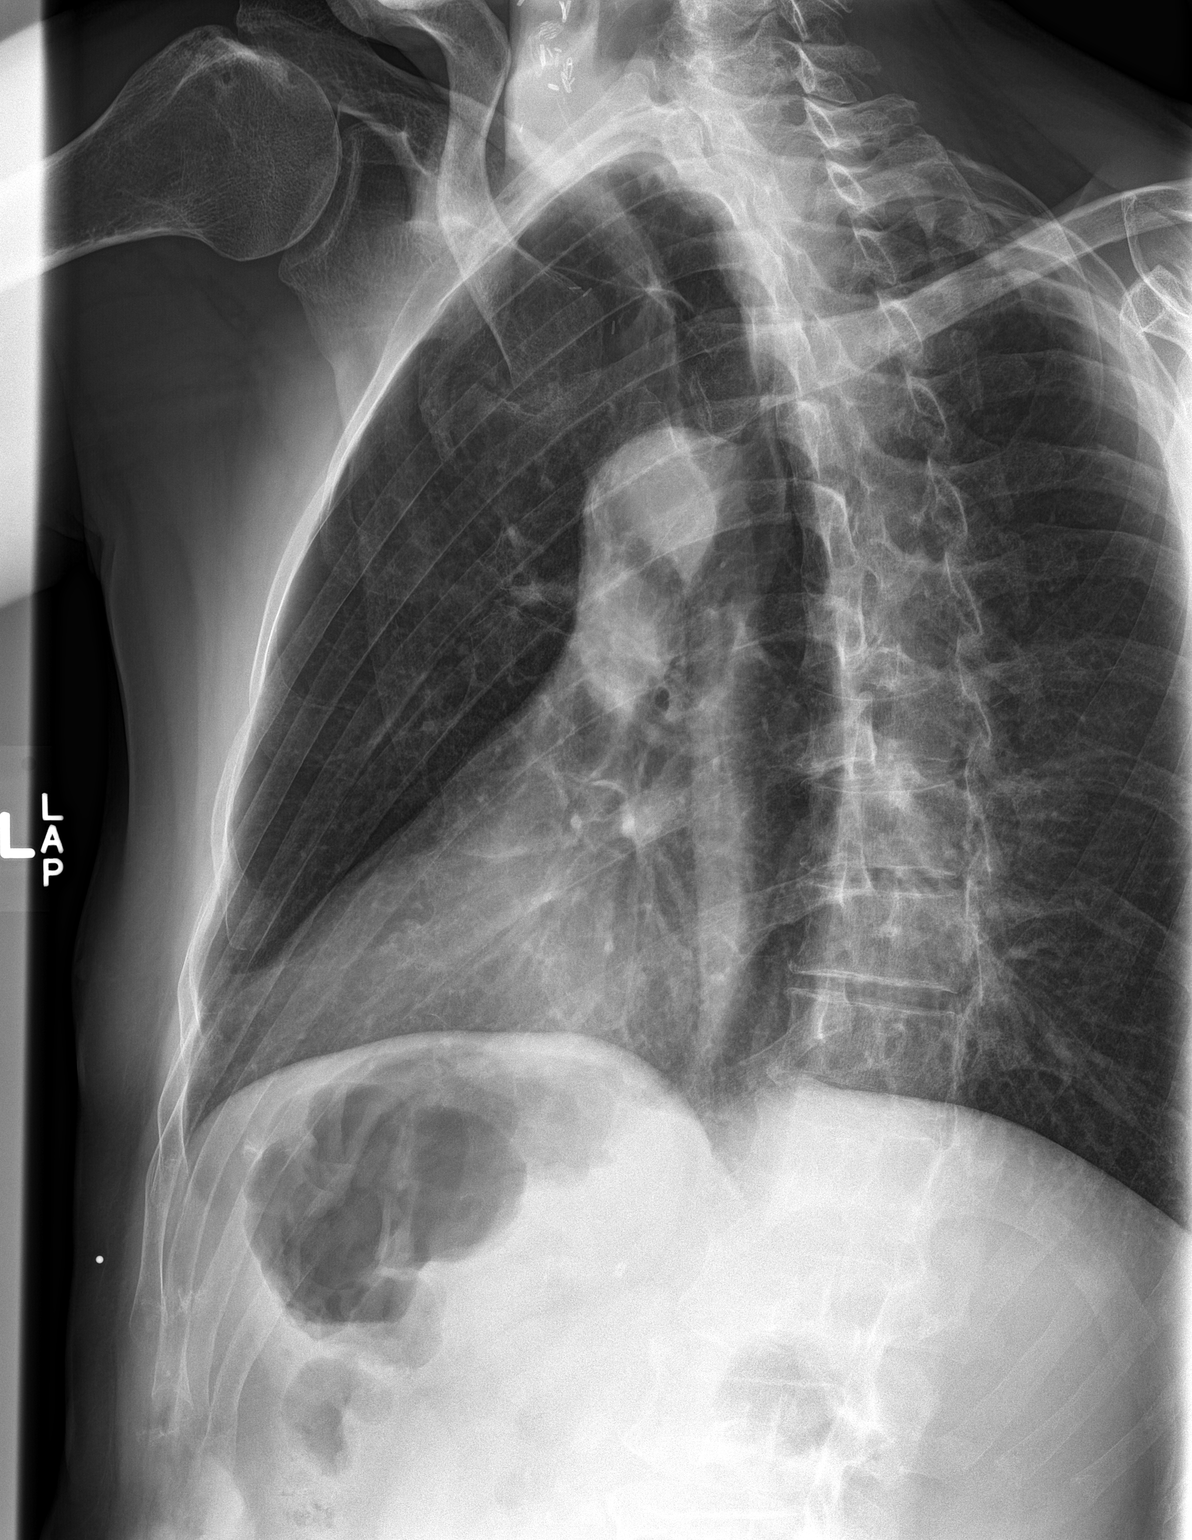

[2 of 2 positions shown; findings below may reference images not displayed]

FINDINGS: Two views of the left ribs. Rib marker placed at the anterior left
6/seventh rib level. Appearance suspicious for nondisplaced fracture
of the anterior left eighth rib. No displaced left rib fracture
identified. No pneumothorax or pleural effusion.
IMPRESSION: Nondisplaced anterior left eighth rib fracture suspected. No left
pneumothorax or pleural effusion identified.

## 2016-06-06 DIAGNOSIS — Z136 Encounter for screening for cardiovascular disorders: Secondary | ICD-10-CM | POA: Insufficient documentation

## 2016-08-22 ENCOUNTER — Ambulatory Visit
Admission: EM | Admit: 2016-08-22 | Discharge: 2016-08-22 | Disposition: A | Discharge status: Home / Self Care | Payer: BLUE CROSS/BLUE SHIELD | Attending: Family Medicine | Admitting: Family Medicine

## 2016-08-22 DIAGNOSIS — J0191 Acute recurrent sinusitis, unspecified: Secondary | ICD-10-CM | POA: Diagnosis not present

## 2016-08-22 HISTORY — DX: Cerebral infarction, unspecified: I63.9

## 2016-08-22 MED ORDER — AMOXICILLIN-POT CLAVULANATE 875-125 MG PO TABS: 1.0000 | ORAL_TABLET | Freq: Two times a day (BID) | ORAL | 0 refills | Status: DC

## 2016-08-22 MED ORDER — PREDNISONE 10 MG (21) PO TBPK: ORAL_TABLET | ORAL | 0 refills | Status: DC

## 2016-08-22 NOTE — ED Triage Notes (Signed)
Pt with nasal and head congestion. "Neon" green secretions. "I have a sinus infection." Headache pain 8/10

## 2016-08-22 NOTE — ED Provider Notes (Signed)
[facility]    CSN: [id number] Arrival date & time: 08/22/16  0900     History   Chief Complaint Chief Complaint  Patient presents with  . Nasal Congestion    HPI Erik Thompson. is a 65 y.o. male.   Patient is a 65 year old white male with a history of oral cancer recurrent sinus infection and recent stroke in the last year. He gets according to him recurrent sinus infection. Augmentin has been the best drug Amoxil is usually not strong enough and he is pretty much as he states it become immune to Zithromax. He states that for a week and a half he's had nasal congestion and coughing. Last 24 hours started developing a neon color as he describes it to the nasal drainage and increased pressure difficult to sleep at night breathing through his nostril. He has bilateral hearing aids as well as he has ear problems also. His wife is also immunize compromise and she's recently had to get treatment for that as well. He is also coughing. He doesn't for she still smokes   The history is provided by the patient. No language interpreter was used.    Past Medical History:  Diagnosis Date  . Cancer ([place])   . CVA (cerebral vascular accident) ([place])   . GERD (gastroesophageal reflux disease)   . Hypercholesteremia     There are no active problems to display for this patient.   Past Surgical History:  Procedure Laterality Date  . HERNIA REPAIR    . KNEE ARTHROSCOPY    . MOUTH SURGERY         Home Medications    Prior to Admission medications   Medication Sig Start Date End Date Taking? Authorizing Provider  amLODipine (NORVASC) 2.5 MG tablet Take 2.5 mg by mouth daily.    Historical Provider, MD  amoxicillin (AMOXIL) 875 MG tablet Take 1 tablet (875 mg total) by mouth 2 (two) times daily. 06/11/15   Norval Gable, MD  amoxicillin-clavulanate (AUGMENTIN) 875-125 MG tablet Take 1 tablet by mouth every 12 (twelve) hours. 04/13/16   Marylene Land, NP    amoxicillin-clavulanate (AUGMENTIN) 875-125 MG tablet Take 1 tablet by mouth 2 (two) times daily. 08/22/16   Frederich Cha, MD  aspirin 81 MG tablet Take 81 mg by mouth daily.    Historical Provider, MD  cetirizine (ZYRTEC) 10 MG tablet Take 10 mg by mouth daily.    Historical Provider, MD  cetirizine-pseudoephedrine (ZYRTEC-D) 5-120 MG tablet Take 1 tablet by mouth 2 (two) times daily. 09/08/15   Lorin Picket, PA-C  chlorpheniramine-HYDROcodone (TUSSIONEX PENNKINETIC ER) 10-8 MG/5ML SUER Take 5 mLs by mouth 2 (two) times daily. 09/08/15   Lorin Picket, PA-C  co-enzyme Q-10 50 MG capsule Take 200 mg by mouth daily.    Historical Provider, MD  Fish Oil-Cholecalciferol (FISH OIL + D3 PO) Take by mouth.    Historical Provider, MD  omeprazole (PRILOSEC OTC) 20 MG tablet Take 20 mg by mouth daily.    Historical Provider, MD  predniSONE (STERAPRED UNI-PAK 21 TAB) 10 MG (21) TBPK tablet Sig 6 tablet day 1, 5 tablets day 2, 4 tablets day 3,,3tablets day 4, 2 tablets day 5, 1 tablet day 6 take all tablets orally 08/22/16   Frederich Cha, MD  rosuvastatin (CRESTOR) 5 MG tablet Take 5 mg by mouth daily.    Historical Provider, MD    Family History Family History  Problem Relation Age of Onset  . Cancer Mother  Social History Social History  Substance Use Topics  . Smoking status: Current Every Day Smoker    Packs/day: 0.50    Types: Cigarettes  . Smokeless tobacco: Never Used  . Alcohol use No     Allergies   Lisinopril and Sulfa antibiotics   Review of Systems Review of Systems  HENT: Positive for hearing loss, rhinorrhea, sinus pain, sinus pressure and sneezing. Negative for ear discharge, ear pain, sore throat, trouble swallowing and voice change.   All other systems reviewed and are negative.    Physical Exam Triage Vital Signs ED Triage Vitals  Enc Vitals Group     BP 08/22/16 0946 135/65     Pulse Rate 08/22/16 0946 60     Resp 08/22/16 0946 20     Temp 08/22/16  0946 97.8 F (36.6 C)     Temp Source 08/22/16 0946 Oral     SpO2 08/22/16 0946 100 %     Weight 08/22/16 0948 146 lb (66.2 kg)     Height 08/22/16 0948 5\' 10"  (1.778 m)     Head Circumference --      Peak Flow --      Pain Score 08/22/16 0951 8     Pain Loc --      Pain Edu? --      Excl. in [place]? --    No data found.   Updated Vital Signs BP 135/65 (BP Location: Right Arm)   Pulse 60   Temp 97.8 F (36.6 C) (Oral)   Resp 20   Ht 5\' 10"  (1.778 m)   Wt 146 lb (66.2 kg)   SpO2 100%   BMI 20.95 kg/m   Visual Acuity Right Eye Distance:   Left Eye Distance:   Bilateral Distance:    Right Eye Near:   Left Eye Near:    Bilateral Near:     Physical Exam  Constitutional: He is oriented to person, place, and time. He appears well-developed and well-nourished.  HENT:  Head: Normocephalic and atraumatic.  Right Ear: Tympanic membrane, external ear and ear canal normal.  Left Ear: Tympanic membrane, external ear and ear canal normal.  Nose: Mucosal edema and rhinorrhea present. Right sinus exhibits maxillary sinus tenderness and frontal sinus tenderness. Left sinus exhibits maxillary sinus tenderness and frontal sinus tenderness.  Bilateral hearing aids present facial symmetry from the previous stroke with drooping on the left side of his face also scarring and oral mucosa Wendy's had surgical repair of elbow cancer and is going on his neck as well.  Eyes: Pupils are equal, round, and reactive to light.  Neck: Normal range of motion. Neck supple.  Pulmonary/Chest: Effort normal.  Musculoskeletal: Normal range of motion.  Neurological: He is alert and oriented to person, place, and time.  Skin: Skin is warm.  Psychiatric: He has a normal mood and affect.     UC Treatments / Results  Labs (all labs ordered are listed, but only abnormal results are displayed) Labs Reviewed - No data to display  EKG  EKG Interpretation None       Radiology No results  found.  Procedures Procedures (including critical care time)  Medications Ordered in UC Medications - No data to display   Initial Impression / Assessment and Plan / UC Course  I have reviewed the triage vital signs and the nursing notes.  Pertinent labs & imaging results that were available during my care of the patient were reviewed by me and considered in my  medical decision making (see chart for details).  Clinical Course     With this history will proceed with Augmentin 875 one tablet twice a day for the next 10 Days Also Pl. a 60 course of prednisone. Follow-up was PCP if not better with his his nose and throat doctor at [facility].  Final Clinical Impressions(s) / UC Diagnoses   Final diagnoses:  Acute recurrent sinusitis, unspecified location    New Prescriptions Discharge Medication List as of 08/22/2016 11:03 AM    START taking these medications   Details  !! amoxicillin-clavulanate (AUGMENTIN) 875-125 MG tablet Take 1 tablet by mouth 2 (two) times daily., Starting Wed 08/22/2016, Normal    predniSONE (STERAPRED UNI-PAK 21 TAB) 10 MG (21) TBPK tablet Sig 6 tablet day 1, 5 tablets day 2, 4 tablets day 3,,3tablets day 4, 2 tablets day 5, 1 tablet day 6 take all tablets orally, Normal     !! - Potential duplicate medications found. Please discuss with provider.       Note: This dictation was prepared with Dragon dictation along with smaller phrase technology. Any transcriptional errors that result from this process are unintentional.   Frederich Cha, MD 08/22/16 1108

## 2016-08-30 DIAGNOSIS — R059 Cough, unspecified: Secondary | ICD-10-CM | POA: Insufficient documentation

## 2016-08-30 DIAGNOSIS — J0100 Acute maxillary sinusitis, unspecified: Secondary | ICD-10-CM | POA: Insufficient documentation

## 2016-12-02 ENCOUNTER — Encounter: Payer: Self-pay | Admitting: Gynecology

## 2016-12-02 ENCOUNTER — Ambulatory Visit
Admission: EM | Admit: 2016-12-02 | Discharge: 2016-12-02 | Disposition: A | Discharge status: Home / Self Care | Payer: BLUE CROSS/BLUE SHIELD | Attending: Family Medicine | Admitting: Family Medicine

## 2016-12-02 DIAGNOSIS — B9789 Other viral agents as the cause of diseases classified elsewhere: Secondary | ICD-10-CM | POA: Diagnosis not present

## 2016-12-02 DIAGNOSIS — J069 Acute upper respiratory infection, unspecified: Secondary | ICD-10-CM | POA: Diagnosis not present

## 2016-12-02 LAB — RAPID STREP SCREEN (MED CTR MEBANE ONLY): STREPTOCOCCUS, GROUP A SCREEN (DIRECT): NEGATIVE

## 2016-12-02 MED ORDER — ALBUTEROL SULFATE HFA 108 (90 BASE) MCG/ACT IN AERS: 1.0000 | INHALATION_SPRAY | Freq: Four times a day (QID) | RESPIRATORY_TRACT | 0 refills | Status: AC | PRN

## 2016-12-02 MED ORDER — HYDROCOD POLST-CPM POLST ER 10-8 MG/5ML PO SUER: 5.0000 mL | Freq: Two times a day (BID) | ORAL | 0 refills | Status: DC | PRN

## 2016-12-02 MED ORDER — PREDNISONE 20 MG PO TABS: 20.0000 mg | ORAL_TABLET | Freq: Every day | ORAL | 0 refills | Status: DC

## 2016-12-02 MED ORDER — BENZONATATE 100 MG PO CAPS: 100.0000 mg | ORAL_CAPSULE | Freq: Three times a day (TID) | ORAL | 0 refills | Status: DC | PRN

## 2016-12-02 NOTE — ED Triage Notes (Signed)
Per patient c/o cold / flu symptoms / sore throat x 3 days.

## 2016-12-02 NOTE — ED Provider Notes (Signed)
[facility]    CSN: [id number] Arrival date & time: 12/02/16  1015     History   Chief Complaint Chief Complaint  Patient presents with  . Cough  . Sore Throat  . Generalized Body Aches    HPI Erik Dottavio. is a 66 y.o. male.   The history is provided by the patient.  Cough  Associated symptoms: rhinorrhea, sore throat and wheezing (mild)   Associated symptoms: no fever   Sore Throat   URI  Presenting symptoms: congestion, cough, fatigue, rhinorrhea and sore throat   Presenting symptoms: no fever   Severity:  Moderate Onset quality:  Sudden Duration:  3 days Timing:  Constant Progression:  Worsening Chronicity:  New Relieved by:  None tried Ineffective treatments:  None tried Associated symptoms: wheezing (mild)   Risk factors: being elderly   Risk factors: no chronic cardiac disease, no chronic kidney disease, no diabetes mellitus, no immunosuppression, no recent illness and no recent travel  Chronic respiratory disease: unknown but chronic smoker.     Past Medical History:  Diagnosis Date  . Cancer ([place])   . CVA (cerebral vascular accident) ([place])   . GERD (gastroesophageal reflux disease)   . Hypercholesteremia     There are no active problems to display for this patient.   Past Surgical History:  Procedure Laterality Date  . HERNIA REPAIR    . KNEE ARTHROSCOPY    . MOUTH SURGERY         Home Medications    Prior to Admission medications   Medication Sig Start Date End Date Taking? Authorizing Provider  amLODipine (NORVASC) 2.5 MG tablet Take 2.5 mg by mouth daily.   Yes Historical Provider, MD  aspirin 81 MG tablet Take 81 mg by mouth daily.   Yes Historical Provider, MD  cetirizine (ZYRTEC) 10 MG tablet Take 10 mg by mouth daily.   Yes Historical Provider, MD  co-enzyme Q-10 50 MG capsule Take 200 mg by mouth daily.   Yes Historical Provider, MD  rosuvastatin (CRESTOR) 5 MG tablet Take 5 mg by mouth daily.   Yes  Historical Provider, MD  albuterol (PROVENTIL HFA;VENTOLIN HFA) 108 (90 Base) MCG/ACT inhaler Inhale 1-2 puffs into the lungs every 6 (six) hours as needed for wheezing or shortness of breath. 12/02/16   Norval Gable, MD  amoxicillin (AMOXIL) 875 MG tablet Take 1 tablet (875 mg total) by mouth 2 (two) times daily. 06/11/15   Norval Gable, MD  amoxicillin-clavulanate (AUGMENTIN) 875-125 MG tablet Take 1 tablet by mouth every 12 (twelve) hours. 04/13/16   Marylene Land, NP  amoxicillin-clavulanate (AUGMENTIN) 875-125 MG tablet Take 1 tablet by mouth 2 (two) times daily. 08/22/16   Frederich Cha, MD  benzonatate (TESSALON) 100 MG capsule Take 1 capsule (100 mg total) by mouth 3 (three) times daily as needed. 12/02/16   Norval Gable, MD  cetirizine-pseudoephedrine (ZYRTEC-D) 5-120 MG tablet Take 1 tablet by mouth 2 (two) times daily. 09/08/15   Lorin Picket, PA-C  chlorpheniramine-HYDROcodone (TUSSIONEX PENNKINETIC ER) 10-8 MG/5ML SUER Take 5 mLs by mouth every 12 (twelve) hours as needed. 12/02/16   Norval Gable, MD  Fish Oil-Cholecalciferol (FISH OIL + D3 PO) Take by mouth.    Historical Provider, MD  omeprazole (PRILOSEC OTC) 20 MG tablet Take 20 mg by mouth daily.    Historical Provider, MD  predniSONE (DELTASONE) 20 MG tablet Take 1 tablet (20 mg total) by mouth daily. 12/02/16   Norval Gable, MD  Family History Family History  Problem Relation Age of Onset  . Cancer Mother     Social History Social History  Substance Use Topics  . Smoking status: Current Every Day Smoker    Packs/day: 0.50    Types: Cigarettes  . Smokeless tobacco: Never Used  . Alcohol use No     Allergies   Lisinopril and Sulfa antibiotics   Review of Systems Review of Systems  Constitutional: Positive for fatigue. Negative for fever.  HENT: Positive for congestion, rhinorrhea and sore throat.   Respiratory: Positive for cough and wheezing (mild).      Physical Exam Triage Vital Signs ED Triage  Vitals  Enc Vitals Group     BP 12/02/16 1025 132/65     Pulse Rate 12/02/16 1025 76     Resp 12/02/16 1025 16     Temp 12/02/16 1025 97.7 F (36.5 C)     Temp Source 12/02/16 1025 Oral     SpO2 12/02/16 1025 100 %     Weight 12/02/16 1027 150 lb (68 kg)     Height 12/02/16 1027 5\' 10"  (1.778 m)     Head Circumference --      Peak Flow --      Pain Score 12/02/16 1029 7     Pain Loc --      Pain Edu? --      Excl. in [place]? --    No data found.   Updated Vital Signs BP 132/65 (BP Location: Left Arm)   Pulse 76   Temp 97.7 F (36.5 C) (Oral)   Resp 16   Ht 5\' 10"  (1.778 m)   Wt 150 lb (68 kg)   SpO2 100%   BMI 21.52 kg/m   Visual Acuity Right Eye Distance:   Left Eye Distance:   Bilateral Distance:    Right Eye Near:   Left Eye Near:    Bilateral Near:     Physical Exam  Constitutional: He appears well-developed and well-nourished. No distress.  HENT:  Head: Normocephalic and atraumatic.  Right Ear: Tympanic membrane, external ear and ear canal normal.  Left Ear: Tympanic membrane, external ear and ear canal normal.  Nose: Rhinorrhea present.  Mouth/Throat: Uvula is midline, oropharynx is clear and moist and mucous membranes are normal. No oropharyngeal exudate or tonsillar abscesses.  Eyes: Conjunctivae and EOM are normal. Pupils are equal, round, and reactive to light. Right eye exhibits no discharge. Left eye exhibits no discharge. No scleral icterus.  Neck: Normal range of motion. Neck supple. No tracheal deviation present. No thyromegaly present.  Cardiovascular: Normal rate, regular rhythm and normal heart sounds.   Pulmonary/Chest: Effort normal and breath sounds normal. No stridor. No respiratory distress. He has no wheezes. He has no rales. He exhibits no tenderness.  Lymphadenopathy:    He has no cervical adenopathy.  Neurological: He is alert.  Skin: Skin is warm and dry. No rash noted. He is not diaphoretic.  Nursing note and vitals  reviewed.    UC Treatments / Results  Labs (all labs ordered are listed, but only abnormal results are displayed) Labs Reviewed  RAPID STREP SCREEN (NOT AT [facility])  CULTURE, GROUP A STREP [facility])    EKG  EKG Interpretation None       Radiology No results found.  Procedures Procedures (including critical care time)  Medications Ordered in UC Medications - No data to display   Initial Impression / Assessment and Plan / UC Course  I have  reviewed the triage vital signs and the nursing notes.  Pertinent labs & imaging results that were available during my care of the patient were reviewed by me and considered in my medical decision making (see chart for details).      Final Clinical Impressions(s) / UC Diagnoses   Final diagnoses:  Viral URI with cough    New Prescriptions Discharge Medication List as of 12/02/2016 11:02 AM    START taking these medications   Details  albuterol (PROVENTIL HFA;VENTOLIN HFA) 108 (90 Base) MCG/ACT inhaler Inhale 1-2 puffs into the lungs every 6 (six) hours as needed for wheezing or shortness of breath., Starting Sun 12/02/2016, Normal    benzonatate (TESSALON) 100 MG capsule Take 1 capsule (100 mg total) by mouth 3 (three) times daily as needed., Starting Sun 12/02/2016, Normal    predniSONE (DELTASONE) 20 MG tablet Take 1 tablet (20 mg total) by mouth daily., Starting Sun 12/02/2016, Normal       1. Lab results and diagnosis reviewed with patient 2. rx as per orders above; reviewed possible side effects, interactions, risks and benefits  3. Recommend supportive treatment with rest, fluids 4. Follow-up prn if symptoms worsen or don't improve   Norval Gable, MD 12/02/16 1113

## 2016-12-05 LAB — CULTURE, GROUP A STREP (THRC)

## 2017-03-25 ENCOUNTER — Encounter: Payer: Self-pay | Admitting: *Deleted

## 2017-03-26 NOTE — Discharge Instructions (Signed)

## 2017-04-03 ENCOUNTER — Ambulatory Visit: Payer: BLUE CROSS/BLUE SHIELD | Admitting: Anesthesiology

## 2017-04-03 ENCOUNTER — Ambulatory Visit
Admission: RE | Admit: 2017-04-03 | Discharge: 2017-04-03 | Disposition: A | Discharge status: Home / Self Care | Payer: BLUE CROSS/BLUE SHIELD | Source: Ambulatory Visit | Attending: Ophthalmology | Admitting: Ophthalmology

## 2017-04-03 ENCOUNTER — Encounter
Admission: RE | Disposition: A | Discharge status: Home / Self Care | Payer: Self-pay | Source: Ambulatory Visit | Attending: Ophthalmology

## 2017-04-03 DIAGNOSIS — I10 Essential (primary) hypertension: Secondary | ICD-10-CM | POA: Diagnosis not present

## 2017-04-03 DIAGNOSIS — Z85810 Personal history of malignant neoplasm of tongue: Secondary | ICD-10-CM | POA: Insufficient documentation

## 2017-04-03 DIAGNOSIS — Z7982 Long term (current) use of aspirin: Secondary | ICD-10-CM | POA: Diagnosis not present

## 2017-04-03 DIAGNOSIS — H2511 Age-related nuclear cataract, right eye: Secondary | ICD-10-CM | POA: Insufficient documentation

## 2017-04-03 DIAGNOSIS — Z9049 Acquired absence of other specified parts of digestive tract: Secondary | ICD-10-CM | POA: Insufficient documentation

## 2017-04-03 DIAGNOSIS — Z8673 Personal history of transient ischemic attack (TIA), and cerebral infarction without residual deficits: Secondary | ICD-10-CM | POA: Diagnosis not present

## 2017-04-03 DIAGNOSIS — E7800 Pure hypercholesterolemia, unspecified: Secondary | ICD-10-CM | POA: Insufficient documentation

## 2017-04-03 DIAGNOSIS — Z79899 Other long term (current) drug therapy: Secondary | ICD-10-CM | POA: Insufficient documentation

## 2017-04-03 DIAGNOSIS — F17200 Nicotine dependence, unspecified, uncomplicated: Secondary | ICD-10-CM | POA: Insufficient documentation

## 2017-04-03 DIAGNOSIS — J449 Chronic obstructive pulmonary disease, unspecified: Secondary | ICD-10-CM | POA: Diagnosis not present

## 2017-04-03 DIAGNOSIS — Z8589 Personal history of malignant neoplasm of other organs and systems: Secondary | ICD-10-CM | POA: Diagnosis not present

## 2017-04-03 HISTORY — DX: Presence of external hearing-aid: Z97.4

## 2017-04-03 HISTORY — PX: CATARACT EXTRACTION W/PHACO: SHX586

## 2017-04-03 HISTORY — DX: Unspecified convulsions: R56.9

## 2017-04-03 HISTORY — DX: Unspecified osteoarthritis, unspecified site: M19.90

## 2017-04-03 HISTORY — DX: Personal history of other diseases of the circulatory system: Z86.79

## 2017-04-03 SURGERY — PHACOEMULSIFICATION, CATARACT, WITH IOL INSERTION
Anesthesia: Monitor Anesthesia Care | Laterality: Right | Wound class: Clean

## 2017-04-03 MED ORDER — LACTATED RINGERS IV SOLN: INTRAVENOUS | Status: DC

## 2017-04-03 MED ORDER — MIDAZOLAM HCL 2 MG/2ML IJ SOLN
INTRAMUSCULAR | Status: DC | PRN
Start: 2017-04-03 — End: 2017-04-03
  Administered 2017-04-03: 2 mg via INTRAVENOUS

## 2017-04-03 MED ORDER — LIDOCAINE HCL 2 % IJ SOLN
INTRAMUSCULAR | Status: DC | PRN
  Administered 2017-04-03: 1 mL

## 2017-04-03 MED ORDER — MOXIFLOXACIN HCL 0.5 % OP SOLN
1.0000 [drp] | OPHTHALMIC | Status: DC | PRN
  Administered 2017-04-03 (×3): 1 [drp] via OPHTHALMIC

## 2017-04-03 MED ORDER — FENTANYL CITRATE (PF) 100 MCG/2ML IJ SOLN
INTRAMUSCULAR | Status: DC | PRN
  Administered 2017-04-03: 50 ug via INTRAVENOUS

## 2017-04-03 MED ORDER — CEFUROXIME OPHTHALMIC INJECTION 1 MG/0.1 ML
INJECTION | OPHTHALMIC | Status: DC | PRN
  Administered 2017-04-03: 0.1 mL via INTRACAMERAL

## 2017-04-03 MED ORDER — ACETAMINOPHEN 325 MG PO TABS: 325.0000 mg | ORAL_TABLET | ORAL | Status: DC | PRN

## 2017-04-03 MED ORDER — EPINEPHRINE PF 1 MG/ML IJ SOLN
INTRAMUSCULAR | Status: DC | PRN
  Administered 2017-04-03: 65 mL via OPHTHALMIC

## 2017-04-03 MED ORDER — NA HYALUR & NA CHOND-NA HYALUR 0.4-0.35 ML IO KIT
PACK | INTRAOCULAR | Status: DC | PRN
  Administered 2017-04-03: 1 mL via INTRAOCULAR

## 2017-04-03 MED ORDER — BRIMONIDINE TARTRATE-TIMOLOL 0.2-0.5 % OP SOLN
OPHTHALMIC | Status: DC | PRN
  Administered 2017-04-03: 1 [drp] via OPHTHALMIC

## 2017-04-03 MED ORDER — ARMC OPHTHALMIC DILATING DROPS
1.0000 "application " | OPHTHALMIC | Status: DC | PRN
  Administered 2017-04-03 (×3): 1 via OPHTHALMIC

## 2017-04-03 MED ORDER — ACETAMINOPHEN 160 MG/5ML PO SOLN: 325.0000 mg | ORAL | Status: DC | PRN

## 2017-04-03 SURGICAL SUPPLY — 25 items
CANNULA ANT/CHMB 27GA (MISCELLANEOUS) ×3 IMPLANT
CARTRIDGE ABBOTT (MISCELLANEOUS) IMPLANT
GLOVE SURG LX 7.5 STRW (GLOVE) ×2
GLOVE SURG LX STRL 7.5 STRW (GLOVE) ×1 IMPLANT
GLOVE SURG TRIUMPH 8.0 PF LTX (GLOVE) ×3 IMPLANT
GOWN STRL REUS W/ TWL LRG LVL3 (GOWN DISPOSABLE) ×2 IMPLANT
GOWN STRL REUS W/TWL LRG LVL3 (GOWN DISPOSABLE) ×4
LENS IOL TECNIS ITEC 21.5 (Intraocular Lens) ×3 IMPLANT
MARKER SKIN DUAL TIP RULER LAB (MISCELLANEOUS) ×3 IMPLANT
NDL RETROBULBAR .5 NSTRL (NEEDLE) IMPLANT
NEEDLE FILTER BLUNT 18X 1/2SAF (NEEDLE) ×2
NEEDLE FILTER BLUNT 18X1 1/2 (NEEDLE) ×1 IMPLANT
PACK CATARACT BRASINGTON (MISCELLANEOUS) ×3 IMPLANT
PACK EYE AFTER SURG (MISCELLANEOUS) ×3 IMPLANT
PACK OPTHALMIC (MISCELLANEOUS) ×3 IMPLANT
RING MALYGIN 7.0 (MISCELLANEOUS) IMPLANT
SUT ETHILON 10-0 CS-B-6CS-B-6 (SUTURE)
SUT VICRYL  9 0 (SUTURE)
SUT VICRYL 9 0 (SUTURE) IMPLANT
SUTURE EHLN 10-0 CS-B-6CS-B-6 (SUTURE) IMPLANT
SYR 3ML LL SCALE MARK (SYRINGE) ×3 IMPLANT
SYR 5ML LL (SYRINGE) ×3 IMPLANT
SYR TB 1ML LUER SLIP (SYRINGE) ×3 IMPLANT
WATER STERILE IRR 250ML POUR (IV SOLUTION) ×3 IMPLANT
WIPE NON LINTING 3.25X3.25 (MISCELLANEOUS) ×3 IMPLANT

## 2017-04-03 NOTE — Anesthesia Preprocedure Evaluation (Signed)
Anesthesia Evaluation  Patient identified by MRN, date of birth, ID band Patient awake    Reviewed: Allergy & Precautions, NPO status , Patient's Chart, lab work & pertinent test results  History of Anesthesia Complications Negative for: history of anesthetic complications  Airway Mallampati: I  TM Distance: >3 FB Neck ROM: Full    Dental  (+)    Pulmonary Current Smoker (1 ppd),    Pulmonary exam normal breath sounds clear to auscultation       Cardiovascular Exercise Tolerance: Good negative cardio ROS Normal cardiovascular exam Rhythm:Regular Rate:Normal     Neuro/Psych Seizures - (x1, not on medication),  HOH, wears hearing aids CVA (11/2015; no deficits) negative psych ROS   GI/Hepatic Neg liver ROS, GERD  ,  Endo/Other  negative endocrine ROS  Renal/GU negative Renal ROS     Musculoskeletal   Abdominal   Peds  Hematology negative hematology ROS (+)   Anesthesia Other Findings   Reproductive/Obstetrics                             Anesthesia Physical Anesthesia Plan  ASA: III  Anesthesia Plan: MAC   Post-op Pain Management:    Induction: Intravenous  PONV Risk Score and Plan: 0  Airway Management Planned:   Additional Equipment:   Intra-op Plan:   Post-operative Plan:   Informed Consent: I have reviewed the patients History and Physical, chart, labs and discussed the procedure including the risks, benefits and alternatives for the proposed anesthesia with the patient or authorized representative who has indicated his/her understanding and acceptance.     Plan Discussed with:   Anesthesia Plan Comments:         Anesthesia Quick Evaluation

## 2017-04-03 NOTE — Transfer of Care (Signed)
Immediate Anesthesia Transfer of Care Note  Patient: Erik Thompson.  Procedure(s) Performed: Procedure(s): CATARACT EXTRACTION PHACO AND INTRAOCULAR LENS PLACEMENT (IOC)  right (Right)  Patient Location: PACU  Anesthesia Type: MAC  Level of Consciousness: awake, alert  and patient cooperative  Airway and Oxygen Therapy: Patient Spontanous Breathing and Patient connected to supplemental oxygen  Post-op Assessment: Post-op Vital signs reviewed, Patient's Cardiovascular Status Stable, Respiratory Function Stable, Patent Airway and No signs of Nausea or vomiting  Post-op Vital Signs: Reviewed and stable  Complications: No apparent anesthesia complications

## 2017-04-03 NOTE — Op Note (Signed)
LOCATION:  [place]   PREOPERATIVE DIAGNOSIS:    Nuclear sclerotic cataract right eye. H25.11   POSTOPERATIVE DIAGNOSIS:  Nuclear sclerotic cataract right eye.     PROCEDURE:  Phacoemusification with posterior chamber intraocular lens placement of the right eye   LENS:   Implant Name Type Inv. Item Serial No. Manufacturer Lot No. LRB No. Used  LENS IOL DIOP 21.5 - [id number] Intraocular Lens LENS IOL DIOP 21.5 [id number] AMO   Right 1        ULTRASOUND TIME: 21 % of 1 minutes, 3 seconds.  CDE 13.2   SURGEON:  Wyonia Hough, MD   ANESTHESIA:  Topical with tetracaine drops and 2% Xylocaine jelly, augmented with 1% preservative-free intracameral lidocaine.    COMPLICATIONS:  None.   DESCRIPTION OF PROCEDURE:  The patient was identified in the holding room and transported to the operating room and placed in the supine position under the operating microscope.  The right eye was identified as the operative eye and it was prepped and draped in the usual sterile ophthalmic fashion.   A 1 millimeter clear-corneal paracentesis was made at the 12:00 position.  0.5 ml of preservative-free 1% lidocaine was injected into the anterior chamber. The anterior chamber was filled with Viscoat viscoelastic.  A 2.4 millimeter keratome was used to make a near-clear corneal incision at the 9:00 position.  A curvilinear capsulorrhexis was made with a cystotome and capsulorrhexis forceps.  Balanced salt solution was used to hydrodissect and hydrodelineate the nucleus.   Phacoemulsification was then used in stop and chop fashion to remove the lens nucleus and epinucleus.  The remaining cortex was then removed using the irrigation and aspiration handpiece. Provisc was then placed into the capsular bag to distend it for lens placement.  A lens was then injected into the capsular bag.  The remaining viscoelastic was aspirated.   Wounds were hydrated with balanced salt solution.  The anterior  chamber was inflated to a physiologic pressure with balanced salt solution.  No wound leaks were noted. Cefuroxime 0.1 ml of a 10mg /ml solution was injected into the anterior chamber for a dose of 1 mg of intracameral antibiotic at the completion of the case.   Timolol and Brimonidine drops were applied to the eye.  The patient was taken to the recovery room in stable condition without complications of anesthesia or surgery.   Chritopher Coster 04/03/2017, 11:04 AM

## 2017-04-03 NOTE — H&P (Signed)
The History and Physical notes are on paper, have been signed, and are to be scanned. The patient remains stable and unchanged from the H&P.   Previous H&P reviewed, patient examined, and there are no changes.  Erik Thompson 04/03/2017 10:17 AM

## 2017-04-03 NOTE — Anesthesia Postprocedure Evaluation (Signed)
Anesthesia Post Note  Patient: Erik Thompson.  Procedure(s) Performed: Procedure(s) (LRB): CATARACT EXTRACTION PHACO AND INTRAOCULAR LENS PLACEMENT (IOC)  right (Right)  Patient location during evaluation: PACU Anesthesia Type: MAC Level of consciousness: awake and alert, oriented and patient cooperative Pain management: pain level controlled Vital Signs Assessment: post-procedure vital signs reviewed and stable Respiratory status: spontaneous breathing, nonlabored ventilation and respiratory function stable Cardiovascular status: blood pressure returned to baseline and stable Postop Assessment: adequate PO intake Anesthetic complications: no    Darrin Nipper

## 2017-08-19 DIAGNOSIS — R6889 Other general symptoms and signs: Secondary | ICD-10-CM | POA: Insufficient documentation

## 2020-11-01 DIAGNOSIS — M272 Inflammatory conditions of jaws: Secondary | ICD-10-CM | POA: Insufficient documentation

## 2021-10-06 LAB — COLOGUARD: COLOGUARD: NEGATIVE

## 2022-02-01 ENCOUNTER — Ambulatory Visit
Admission: EM | Admit: 2022-02-01 | Discharge: 2022-02-01 | Disposition: A | Discharge status: Home / Self Care | Payer: BC Managed Care – PPO

## 2022-02-01 DIAGNOSIS — H9190 Unspecified hearing loss, unspecified ear: Secondary | ICD-10-CM | POA: Insufficient documentation

## 2022-02-01 DIAGNOSIS — H66012 Acute suppurative otitis media with spontaneous rupture of ear drum, left ear: Secondary | ICD-10-CM | POA: Diagnosis not present

## 2022-02-01 MED ORDER — AMOXICILLIN-POT CLAVULANATE 875-125 MG PO TABS: 1.0000 | ORAL_TABLET | Freq: Two times a day (BID) | ORAL | 0 refills | Status: AC

## 2022-02-01 NOTE — Discharge Instructions (Signed)
Take the Augmentin twice daily for 10 days with food for treatment of your ear infection. ? ?Take an over-the-counter probiotic 1 hour after each dose of antibiotic to prevent diarrhea. ? ?Use over-the-counter Tylenol and ibuprofen as needed for pain or fever. ? ?Place a hot water bottle, or heating pad, underneath your pillowcase at night to help dilate up your ear and aid in pain relief as well as resolution of the infection. ? ?Wear a silicone ear plug when in the shower to prevent water from getting in your ear. ? ?Follow-up with your PCP in 4 weeks to ensure that your ear drum has healed. ? ?Return for reevaluation for any new or worsening symptoms.  ?

## 2022-02-01 NOTE — ED Triage Notes (Signed)
Patient is here for Left ear pain "with drainage". Started last night. No fever. No injury. Note: Wears bilateral hearing aides.  ?

## 2022-02-01 NOTE — ED Provider Notes (Signed)
[name] ? ? ?CSN: [id number] ?Arrival date & time: 02/01/22  1346 ? ? ?  ? ?History   ?Chief Complaint ?Chief Complaint  ?Patient presents with  ? Otalgia  ? ? ?HPI ?Erik Thompson. is a 71 y.o. male.  ? ?HPI ? ?71 year old male here for evaluation of left ear pain. ? ?Patient reports that he has been experiencing pain in his left ear for last 2 days.  When he woke up this morning he had a bunch of dried drainage on his left ear.  He states he did not look at the drainage so he does not know what it looks like.  He does wear hearing aids in both ears but has been keeping the left hearing aid out at present.  He denies any fever, runny nose, or nasal congestion. ? ?Past Medical History:  ?Diagnosis Date  ? Arthritis   ? hands  ? Cancer [facility])   ? CVA (cerebral vascular accident) ([place]) 11/2015  ? no deficits  ? GERD (gastroesophageal reflux disease)   ? Hearing aid worn   ? bilateral  ? History of hypertension   ? Hypercholesteremia   ? Seizures ([place])   ? x1. approx 2007. Pt not sure of dx.  ? ? ?Patient Active Problem List  ? Diagnosis Date Noted  ? Hearing loss 02/01/2022  ? 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 Cold intolerance 08/19/2017  ? Cough 08/30/2016  ? Subacute maxillary sinusitis 08/30/2016  ? Screening for abdominal aortic aneurysm 06/06/2016  ? Leg pain, bilateral 06/04/2016  ? Essential hypertension 12/15/2015  ? Ischemic stroke ([place]) 12/02/2015  ? Tobacco use disorder 12/02/2015  ? 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 ? ?Past Surgical History:  ?Procedure Laterality Date  ? CATARACT EXTRACTION W/PHACO Right 04/03/2017  ? Procedure: CATARACT EXTRACTION PHACO AND INTRAOCULAR LENS PLACEMENT ([place])  right;  Surgeon: Leandrew Koyanagi, MD;  Location: [place];  Service: Ophthalmology;  Laterality: Right;  ? HERNIA REPAIR    ? KNEE ARTHROSCOPY    ? MOUTH SURGERY    ? ? ? ? ? ?Home Medications   ? ?Prior to Admission medications   ?Medication Sig Start  Date End Date Taking? Authorizing Provider  ?amoxicillin-clavulanate (AUGMENTIN) 875-125 MG tablet Take 1 tablet by mouth every 12 (twelve) hours for 10 days. 02/01/22 02/11/22 Yes Margarette Canada, NP  ?acyclovir (ZOVIRAX) 400 MG tablet acyclovir 400 mg tablet ? TAKE 1 TABLET BY MOUTH EVERY 8 HOURS AS NEEDED 08/19/17   [provider]  ?albuterol (PROVENTIL HFA;VENTOLIN HFA) 108 (90 Base) MCG/ACT inhaler Inhale 1-2 puffs into the lungs every 6 (six) hours as needed for wheezing or shortness of breath. ?Patient not taking: Reported on 03/25/2017 12/02/16   Norval Gable, MD  ?ALPRAZolam Duanne Moron) 0.5 MG tablet alprazolam 0.5 mg tablet ? TAKE 1 TABLET BY MOUTH THREE TIMES DAILY AS NEEDED    [provider]  ?amLODipine (NORVASC) 2.5 MG tablet Take 2.5 mg by mouth daily.    [provider]  ?aspirin 325 MG tablet Take by mouth.    [provider]  ?aspirin 81 MG chewable tablet Chew 1 tablet by mouth daily. 11/26/16   [provider]  ?azithromycin (ZITHROMAX) 250 MG tablet azithromycin 250 mg tablet ? TK 2 TS PO ON DAY 1, THEN TK 1 T PO D FOR 4 DAYS    [provider]  ?benzonatate (TESSALON) 100 MG capsule benzonatate 100 mg capsule ? TAKE 1 CAPSULE BY  MOUTH THREE TIMES DAILY AS NEEDED FOR COUGH    [provider]  ?cetirizine (ZYRTEC) 10 MG tablet Take 10 mg by mouth daily.    [provider]  ?Cholecalciferol 125 MCG (5000 UT) TABS Take by mouth.    [provider]  ?co-enzyme Q-10 30 MG capsule Take by mouth.    [provider]  ?co-enzyme Q-10 50 MG capsule Take 200 mg by mouth daily.    [provider]  ?Coenzyme Q10 200 MG capsule Take by mouth.    [provider]  ?cyanocobalamin 1000 MCG tablet Take by mouth.    [provider]  ?cyclobenzaprine (FLEXERIL) 5 MG tablet cyclobenzaprine 5 mg tablet ? TAKE 1 TO 2 TABLETS BY MOUTH THREE TIMES DAILY AS NEEDED FOR BACK PAIN OR MUSCLE SPASM    [provider]  ?ibuprofen (ADVIL) 200 MG tablet Take by mouth.    [provider]  ?naproxen (NAPROSYN) 500 MG tablet Take 500 mg by mouth 2 (two) times daily. 10/27/21   [provider]  ?nystatin (MYCOSTATIN) 100000 UNIT/ML suspension nystatin 100,000 unit/mL oral suspension ? SWISH AND SPIT 5 ML BY MOUTH FOUR TIMES DAILY FOR 10 DAYS    [provider]  ?oxyCODONE-acetaminophen (PERCOCET/ROXICET) 5-325 MG tablet oxycodone-acetaminophen 5 mg-325 mg tablet ? TAKE 1-2 TABLETS BY MOUTH EVERY 4 HOURS AS NEEDED FOR PAIN    [provider]  ?pentoxifylline (TRENTAL) 400 MG CR tablet pentoxifylline ER 400 mg tablet,extended release 08/04/21   [provider]  ?pentoxifylline (TRENTAL) 400 MG CR tablet Take 400 mg by mouth 2 (two) times daily. 01/01/22   [provider]  ?Potassium Gluconate 2.5 MEQ TABS Take by mouth.    [provider]  ?rosuvastatin (CRESTOR) 20 MG tablet rosuvastatin 20 mg tablet ? TAKE 1 TABLET BY MOUTH EVERY DAY    [provider]  ?sildenafil (VIAGRA) 100 MG tablet sildenafil 100 mg tablet ? TAKE 1 TABLET BY MOUTH EVERY DAY AS NEEDED    [provider]  ?sodium chloride 1 g tablet sodium chloride 1,000 mg soluble tablet ? TAKE 1 TABLET BY MOUTH TWICE DAILY    [provider]  ?sodium fluoride (FLUORISHIELD) 1.1 % GEL dental gel sodium fluoride 1.1 % dental paste ? USE TWICE DAILY AS DIRECTED    [provider]  ?tamsulosin (FLOMAX) 0.4 MG CAPS capsule tamsulosin 0.4 mg capsule ? TAKE 1 CAPSULE BY MOUTH EVERY DAY    [provider]  ?traMADol (ULTRAM) 50 MG tablet tramadol 50 mg tablet ? TAKE 1 TABLET BY MOUTH EVERY 6 HOURS    [provider]  ? ? ?Family History ?Family History  ?Problem Relation Age of Onset  ? Cancer Mother   ? ? ?Social History ?Social History  ? ?Tobacco Use  ? Smoking status: Every Day  ?  Packs/day: 1.00  ?  Years: 20.00  ?  Pack years: 20.00  ?  Types: Cigarettes   ? Smokeless tobacco: Never  ?Vaping Use  ? Vaping Use: Never used  ?Substance Use Topics  ? Alcohol use: No  ?  Alcohol/week: 0.0 standard drinks  ? Drug use: No  ? ? ? ?Allergies   ?Lisinopril, Other, Pecan nut (diagnostic), Pistachio nut (diagnostic), and Sulfa antibiotics ? ? ?Review of Systems ?Review of Systems  ?Constitutional:  Negative for fever.  ?HENT:  Positive for ear discharge and ear pain. Negative for congestion and rhinorrhea.   ?Hematological: Negative.   ?Psychiatric/Behavioral: Negative.    ? ? ?  Physical Exam ?Triage Vital Signs ?ED Triage Vitals  ?Enc Vitals Group  ?   BP 02/01/22 1402 138/65  ?   Pulse Rate 02/01/22 1402 69  ?   Resp 02/01/22 1402 18  ?   Temp 02/01/22 1402 (!) 97.5 ?F (36.4 ?C)  ?   Temp Source 02/01/22 1402 Oral  ?   SpO2 02/01/22 1402 99 %  ?   Weight 02/01/22 1400 148 lb (67.1 kg)  ?   Height 02/01/22 1400 5' 10.5" (1.791 m)  ?   Head Circumference --   ?   Peak Flow --   ?   Pain Score 02/01/22 1353 2  ?   Pain Loc --   ?   Pain Edu? --   ?   Excl. in [place]? --   ? ?No data found. ? ?Updated Vital Signs ?BP 138/65 (BP Location: Left Arm)   Pulse 69   Temp (!) 97.5 ?F (36.4 ?C) (Oral)   Resp 18   Ht 5' 10.5" (1.791 m)   Wt 148 lb (67.1 kg)   SpO2 99%   BMI 20.94 kg/m?  ? ?Visual Acuity ?Right Eye Distance:   ?Left Eye Distance:   ?Bilateral Distance:   ? ?Right Eye Near:   ?Left Eye Near:    ?Bilateral Near:    ? ?Physical Exam ?Vitals and nursing note reviewed.  ?Constitutional:   ?   Appearance: Normal appearance. He is not ill-appearing.  ?HENT:  ?   Head: Normocephalic and atraumatic.  ?   Left Ear: External ear normal. There is no impacted cerumen.  ?Skin: ?   General: Skin is warm and dry.  ?   Capillary Refill: Capillary refill takes less than 2 seconds.  ?   Findings: No erythema or rash.  ?Neurological:  ?   General: No focal deficit present.  ?   Mental Status: He is alert and oriented to person, place, and time.  ?Psychiatric:     ?   Mood and Affect: Mood  normal.     ?   Behavior: Behavior normal.     ?   Thought Content: Thought content normal.     ?   Judgment: Judgment normal.  ? ? ? ?UC Treatments / Results  ?Labs ?(all labs ordered are listed, but only abnormal res

## 2024-09-11 ENCOUNTER — Ambulatory Visit

## 2024-09-11 ENCOUNTER — Ambulatory Visit: Payer: Self-pay | Admitting: Physician Assistant

## 2024-09-11 ENCOUNTER — Ambulatory Visit
Admission: EM | Admit: 2024-09-11 | Discharge: 2024-09-11 | Disposition: A | Discharge status: Home / Self Care | Attending: Physician Assistant | Admitting: Physician Assistant

## 2024-09-11 ENCOUNTER — Encounter: Payer: Self-pay | Admitting: Emergency Medicine

## 2024-09-11 DIAGNOSIS — R051 Acute cough: Secondary | ICD-10-CM

## 2024-09-11 DIAGNOSIS — J441 Chronic obstructive pulmonary disease with (acute) exacerbation: Secondary | ICD-10-CM

## 2024-09-11 MED ORDER — PREDNISONE 20 MG PO TABS: 40.0000 mg | ORAL_TABLET | Freq: Every day | ORAL | 0 refills | Status: AC

## 2024-09-11 MED ORDER — DOXYCYCLINE HYCLATE 100 MG PO CAPS: 100.0000 mg | ORAL_CAPSULE | Freq: Two times a day (BID) | ORAL | 0 refills | Status: AC

## 2024-09-11 NOTE — ED Provider Notes (Signed)
 " [facility]    CSN: [id number] Arrival date & time: 09/11/24  1532      History   Chief Complaint Chief Complaint  Patient presents with   Cough    HPI Erik Thompson. is a 73 y.o. male with history of stroke, mouth cancer, tobacco abuse, COPD, and hyperlipidemia. Patient presents today for cough, congestion, runny nose x 1 week.  Reports being a little more short of breath from baseline.  Patient reports being sick about 3 weeks ago and needing antibiotics and steroids.  He says he took azithromycin and Augmentin .  Reports feeling better for short time before becoming sick again.  He thinks it has to do with the weather change from [place] .  History of tobacco abuse and continues to smoke.  Uses Breztri inhaler and albuterol  as needed.  He has not had any fevers.  He does report cough is productive.  No weakness.  No other concerns.  HPI  Past Medical History:  Diagnosis Date   Arthritis    hands   Cancer (HCC)    CVA (cerebral vascular accident) (HCC) 11/2015   no deficits   GERD (gastroesophageal reflux disease)    Hearing aid worn    bilateral   History of hypertension    Hypercholesteremia    Seizures (HCC)    x1. approx 2007. Pt not sure of dx.    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 Authorizing Provider  doxycycline (VIBRAMYCIN) 100 MG capsule Take 1 capsule (100 mg total) by mouth 2 (two) times daily for 7 days. 09/11/24 09/18/24 Yes Arvis Jolan NOVAK, PA-C  predniSONE  (DELTASONE ) 20 MG tablet Take 2 tablets (40 mg total) by mouth daily for 5 days. 09/11/24 09/16/24 Yes Arvis Jolan NOVAK, PA-C  acyclovir (ZOVIRAX) 400 MG tablet acyclovir 400 mg tablet  TAKE 1 TABLET BY MOUTH EVERY 8 HOURS AS NEEDED 08/19/17   [provider]  albuterol  (PROVENTIL  HFA;VENTOLIN  HFA) 108 (90 Base) MCG/ACT inhaler Inhale 1-2 puffs into the lungs every 6 (six) hours as needed for wheezing or shortness of breath. Patient not taking: Reported on 03/25/2017 12/02/16   Servando Hire, MD  ALPRAZolam (XANAX) 0.5 MG tablet alprazolam 0.5 mg tablet  TAKE 1 TABLET BY MOUTH THREE TIMES DAILY AS NEEDED    [provider]  amLODipine (NORVASC) 2.5 MG tablet Take 2.5 mg by mouth daily.    [provider]  aspirin 325 MG tablet Take by mouth.    [provider]  aspirin 81 MG chewable tablet Chew 1 tablet by mouth daily. 11/26/16  [provider]  azithromycin (ZITHROMAX) 250 MG tablet azithromycin 250 mg tablet  TK 2 TS PO ON DAY 1, THEN TK 1 T PO D FOR 4 DAYS    [provider]  benzonatate  (TESSALON ) 100 MG capsule benzonatate  100 mg capsule  TAKE 1 CAPSULE BY MOUTH THREE TIMES DAILY AS NEEDED FOR COUGH    [provider]  cetirizine  (ZYRTEC ) 10 MG tablet Take 10 mg by mouth daily.    [provider]  Cholecalciferol 125 MCG (5000 UT) TABS Take by mouth.    [provider]  co-enzyme Q-10 30 MG capsule Take by mouth.    [provider]  co-enzyme Q-10 50 MG capsule Take 200 mg by mouth daily.     [provider]  Coenzyme Q10 200 MG capsule Take by mouth.    [provider]  cyanocobalamin 1000 MCG tablet Take by mouth.    [provider]  cyclobenzaprine (FLEXERIL) 5 MG tablet cyclobenzaprine 5 mg tablet  TAKE 1 TO 2 TABLETS BY MOUTH THREE TIMES DAILY AS NEEDED FOR BACK PAIN OR MUSCLE SPASM    [provider]  ibuprofen (ADVIL) 200 MG tablet Take by mouth.    [provider]  naproxen (NAPROSYN) 500 MG tablet Take 500 mg by mouth 2 (two) times daily. 10/27/21   [provider]  nystatin (MYCOSTATIN) 100000 UNIT/ML suspension nystatin 100,000 unit/mL oral suspension  SWISH AND SPIT 5 ML BY MOUTH FOUR TIMES DAILY FOR 10 DAYS    [provider]  oxyCODONE-acetaminophen  (PERCOCET/ROXICET) 5-325 MG tablet oxycodone-acetaminophen  5 mg-325 mg tablet  TAKE 1-2 TABLETS BY MOUTH EVERY 4 HOURS AS NEEDED FOR PAIN    [provider]  pentoxifylline (TRENTAL) 400 MG CR tablet pentoxifylline ER 400 mg tablet,extended release 08/04/21   [provider]  pentoxifylline (TRENTAL) 400 MG CR tablet Take 400 mg by mouth 2 (two) times daily. 01/01/22   [provider]  Potassium Gluconate 2.5 MEQ TABS Take by mouth.    [provider]  rosuvastatin (CRESTOR) 20 MG tablet rosuvastatin 20 mg tablet  TAKE 1 TABLET BY MOUTH EVERY DAY    [provider]  sildenafil (VIAGRA) 100 MG tablet sildenafil 100 mg tablet  TAKE 1 TABLET BY MOUTH EVERY DAY AS NEEDED    [provider]  sodium chloride 1 g tablet sodium chloride 1,000 mg soluble tablet  TAKE 1 TABLET BY MOUTH TWICE DAILY    [provider]  sodium fluoride (FLUORISHIELD) 1.1 % GEL dental gel sodium fluoride 1.1 % dental paste  USE TWICE DAILY AS DIRECTED    [provider]  tamsulosin (FLOMAX) 0.4 MG CAPS capsule tamsulosin 0.4 mg capsule  TAKE 1 CAPSULE BY MOUTH EVERY DAY    [provider]  traMADol (ULTRAM) 50 MG  tablet tramadol 50 mg tablet  TAKE 1 TABLET BY MOUTH EVERY 6 HOURS    [provider]    Family History Family History  Problem Relation Age of Onset   Cancer Mother     Social History Social History[1]   Allergies   Lisinopril, Other, Pecan nut (diagnostic), Pistachio nut (diagnostic), and Sulfa antibiotics   Review of Systems Review of Systems  Constitutional:  Positive for fatigue. Negative for fever.  HENT:  Positive for congestion and rhinorrhea. Negative for sinus pain and sore throat.   Respiratory:  Positive for cough and shortness of breath.   Cardiovascular:  Negative for chest pain.  Gastrointestinal:  Negative  for abdominal pain, diarrhea, nausea and vomiting.  Musculoskeletal:  Negative for myalgias.  Neurological:  Negative for weakness, light-headedness and headaches.  Hematological:  Negative for adenopathy.     Physical Exam Triage Vital Signs ED Triage Vitals  Encounter Vitals Group     BP      Girls Systolic BP Percentile      Girls Diastolic BP Percentile      Boys Systolic BP Percentile      Boys Diastolic BP Percentile      Pulse      Resp      Temp      Temp src      SpO2      Weight      Height      Head Circumference      Peak Flow      Pain Score      Pain Loc      Pain Education      Exclude from Growth Chart    No data found.  Updated Vital Signs BP (!) 162/70 (BP Location: Left Arm)   Pulse 60   Temp 98 F (36.7 C) (Oral)   Resp 16   Ht 5' 10.5 (1.791 m)   Wt 147 lb 14.9 oz (67.1 kg)   SpO2 100%   BMI 20.93 kg/m       Physical Exam Vitals and nursing note reviewed.  Constitutional:      General: He is not in acute distress.    Appearance: Normal appearance. He is well-developed. He is not ill-appearing.  HENT:     Head: Normocephalic and atraumatic.     Right Ear: Tympanic membrane, ear canal and external ear normal.     Left Ear: Tympanic membrane, ear canal and external ear normal.     Nose:  Congestion present.     Mouth/Throat:     Mouth: Mucous membranes are moist.     Pharynx: Oropharynx is clear.  Eyes:     Conjunctiva/sclera: Conjunctivae normal.  Cardiovascular:     Rate and Rhythm: Normal rate and regular rhythm.  Pulmonary:     Effort: Pulmonary effort is normal. No respiratory distress.     Breath sounds: Rhonchi present.  Musculoskeletal:     Cervical back: Neck supple.  Skin:    General: Skin is warm and dry.     Capillary Refill: Capillary refill takes less than 2 seconds.  Neurological:     General: No focal deficit present.     Mental Status: He is alert. Mental status is at baseline.     Motor: No weakness.     Gait: Gait normal.  Psychiatric:        Mood and Affect: Mood normal.        Behavior: Behavior normal.      UC Treatments / Results  Labs (all labs ordered are listed, but only abnormal results are displayed) Labs Reviewed - No data to display  EKG   Radiology No results found.  Procedures Procedures (including critical care time)  Medications Ordered in UC Medications - No data to display  Initial Impression / Assessment and Plan / UC Course  I have reviewed the triage vital signs and the nursing notes.  Pertinent labs & imaging results that were available during my care of the patient were reviewed by me and considered in my medical decision making (see chart for details).   73 year old male with history of COPD, tobacco abuse and mouth cancer  presents for cough and congestion over the past week.  Similar symptoms 3 weeks ago and was treated with azithromycin, Augmentin  and prednisone .  No fevers.  He is afebrile.  Overall well-appearing.  No acute distress.  On exam has nasal congestion.  Throat is clear.  Prescribed rhonchi.  Chest x-ray performed.  Wet read negative for acute abnormality which I discussed with patient.  Advised I will contact him with radiologist overread is different.  At this time we will treat for  COPD exacerbation with doxycycline  and prednisone .  Encouraged increasing rest and fluids.  Advised that I would amend treatment plan based on chest x-ray results if needed.  Reviewed continue inhalers at home.  Reviewed return precautions.  Reviewed ED precautions.  Patient declined an AVS.  X-ray consistent with COPD.  No evidence of pneumonia.  Attempted to contact patient x 2 to discuss results.  No change to treatment plan.   Final Clinical Impressions(s) / UC Diagnoses   Final diagnoses:  Acute cough  COPD exacerbation [facility])   Discharge Instructions   None    ED Prescriptions     Medication Sig Dispense Auth. Provider   doxycycline  (VIBRAMYCIN ) 100 MG capsule Take 1 capsule (100 mg total) by mouth 2 (two) times daily for 7 days. 14 capsule Arvis Huxley B, PA-C   predniSONE  (DELTASONE ) 20 MG tablet Take 2 tablets (40 mg total) by mouth daily for 5 days. 10 tablet Elesia Pemberton B, PA-C      PDMP not reviewed this encounter.     [1]  Social History Tobacco Use   Smoking status: Every Day    Current packs/day: 1.00    Average packs/day: 1 pack/day for 20.0 years (20.0 ttl pk-yrs)    Types: Cigarettes   Smokeless tobacco: Never  Vaping Use   Vaping status: Never Used  Substance Use Topics   Alcohol use: No    Alcohol/week: 0.0 standard drinks of alcohol   Drug use: No     Arvis Huxley NOVAK, PA-C 09/11/24 1806  "

## 2024-09-11 NOTE — ED Triage Notes (Signed)
 Pt c/o runny nose, cough. He states he had fluid on his lungs back at thanksgiving and was treated with antibiotic. He states he got better but has not fully gone away. He wants to make his lungs are clearing up from the fluid.
